# Patient Record
Sex: Female | Born: 1960 | Race: White | Hispanic: No | Marital: Married | Smoking: Former smoker
Health system: Southern US, Community
[De-identification: ages and names within clinical notes are randomized; demographics above are authoritative.]

## PROBLEM LIST (undated history)

## (undated) DIAGNOSIS — Z86018 Personal history of other benign neoplasm: Secondary | ICD-10-CM

## (undated) DIAGNOSIS — L570 Actinic keratosis: Secondary | ICD-10-CM

## (undated) HISTORY — DX: Personal history of other benign neoplasm: Z86.018

## (undated) HISTORY — DX: Actinic keratosis: L57.0

---

## 2000-05-11 DIAGNOSIS — Z86018 Personal history of other benign neoplasm: Secondary | ICD-10-CM

## 2000-05-11 HISTORY — DX: Personal history of other benign neoplasm: Z86.018

## 2004-07-09 ENCOUNTER — Ambulatory Visit: Payer: Self-pay | Admitting: Obstetrics and Gynecology

## 2005-09-14 ENCOUNTER — Ambulatory Visit: Payer: Self-pay | Admitting: Obstetrics and Gynecology

## 2006-09-29 ENCOUNTER — Ambulatory Visit: Payer: Self-pay | Admitting: Obstetrics and Gynecology

## 2007-11-29 ENCOUNTER — Ambulatory Visit: Payer: Self-pay | Admitting: Obstetrics and Gynecology

## 2008-12-10 ENCOUNTER — Ambulatory Visit: Payer: Self-pay | Admitting: Obstetrics and Gynecology

## 2010-03-25 ENCOUNTER — Ambulatory Visit: Payer: Self-pay | Admitting: Obstetrics and Gynecology

## 2011-07-02 ENCOUNTER — Ambulatory Visit: Payer: Self-pay | Admitting: Obstetrics and Gynecology

## 2012-08-07 ENCOUNTER — Ambulatory Visit: Payer: Self-pay | Admitting: Obstetrics and Gynecology

## 2013-10-24 LAB — HM COLONOSCOPY

## 2013-10-26 LAB — HM COLONOSCOPY

## 2013-10-29 ENCOUNTER — Ambulatory Visit: Payer: Self-pay | Admitting: Obstetrics and Gynecology

## 2014-10-15 ENCOUNTER — Other Ambulatory Visit: Payer: Self-pay | Admitting: Obstetrics and Gynecology

## 2014-10-15 DIAGNOSIS — Z1231 Encounter for screening mammogram for malignant neoplasm of breast: Secondary | ICD-10-CM

## 2014-10-31 ENCOUNTER — Ambulatory Visit
Admission: RE | Admit: 2014-10-31 | Discharge: 2014-10-31 | Disposition: A | Discharge status: Home / Self Care | Payer: 59 | Source: Ambulatory Visit | Attending: Obstetrics and Gynecology | Admitting: Obstetrics and Gynecology

## 2014-10-31 DIAGNOSIS — Z1231 Encounter for screening mammogram for malignant neoplasm of breast: Secondary | ICD-10-CM | POA: Insufficient documentation

## 2014-11-05 ENCOUNTER — Other Ambulatory Visit: Payer: Self-pay | Admitting: Obstetrics and Gynecology

## 2014-11-05 DIAGNOSIS — R928 Other abnormal and inconclusive findings on diagnostic imaging of breast: Secondary | ICD-10-CM

## 2014-11-20 ENCOUNTER — Ambulatory Visit
Admission: RE | Admit: 2014-11-20 | Discharge: 2014-11-20 | Disposition: A | Discharge status: Home / Self Care | Payer: 59 | Source: Ambulatory Visit | Attending: Obstetrics and Gynecology | Admitting: Obstetrics and Gynecology

## 2014-11-20 DIAGNOSIS — R928 Other abnormal and inconclusive findings on diagnostic imaging of breast: Secondary | ICD-10-CM | POA: Diagnosis present

## 2014-11-20 DIAGNOSIS — N63 Unspecified lump in breast: Secondary | ICD-10-CM | POA: Insufficient documentation

## 2015-10-16 ENCOUNTER — Other Ambulatory Visit: Payer: Self-pay | Admitting: Obstetrics and Gynecology

## 2015-10-16 DIAGNOSIS — Z1231 Encounter for screening mammogram for malignant neoplasm of breast: Secondary | ICD-10-CM

## 2015-11-11 ENCOUNTER — Ambulatory Visit
Admission: RE | Admit: 2015-11-11 | Discharge: 2015-11-11 | Disposition: A | Discharge status: Home / Self Care | Payer: 59 | Source: Ambulatory Visit | Attending: Obstetrics and Gynecology | Admitting: Obstetrics and Gynecology

## 2015-11-11 DIAGNOSIS — Z1231 Encounter for screening mammogram for malignant neoplasm of breast: Secondary | ICD-10-CM | POA: Insufficient documentation

## 2016-05-25 DIAGNOSIS — R232 Flushing: Secondary | ICD-10-CM | POA: Diagnosis not present

## 2016-05-25 DIAGNOSIS — N951 Menopausal and female climacteric states: Secondary | ICD-10-CM | POA: Diagnosis not present

## 2016-05-25 DIAGNOSIS — N949 Unspecified condition associated with female genital organs and menstrual cycle: Secondary | ICD-10-CM | POA: Diagnosis not present

## 2016-08-30 ENCOUNTER — Telehealth: Payer: Self-pay | Admitting: *Deleted

## 2016-08-31 ENCOUNTER — Telehealth: Payer: Self-pay | Admitting: Internal Medicine

## 2016-08-31 NOTE — Telephone Encounter (Signed)
Per Dr. Derrel Nip , she will take as a new patient.

## 2016-10-15 ENCOUNTER — Other Ambulatory Visit: Payer: Self-pay | Admitting: Obstetrics and Gynecology

## 2016-10-15 DIAGNOSIS — Z1231 Encounter for screening mammogram for malignant neoplasm of breast: Secondary | ICD-10-CM

## 2016-10-25 ENCOUNTER — Ambulatory Visit (INDEPENDENT_AMBULATORY_CARE_PROVIDER_SITE_OTHER): Payer: 59 | Admitting: Internal Medicine

## 2016-10-25 ENCOUNTER — Encounter: Payer: Self-pay | Admitting: Internal Medicine

## 2016-10-25 VITALS — BP 118/78 | HR 97 | Temp 98.3°F | Resp 16 | Ht 63.5 in | Wt 130.1 lb

## 2016-10-25 DIAGNOSIS — R030 Elevated blood-pressure reading, without diagnosis of hypertension: Secondary | ICD-10-CM

## 2016-10-25 DIAGNOSIS — Z91018 Allergy to other foods: Secondary | ICD-10-CM | POA: Diagnosis not present

## 2016-10-25 DIAGNOSIS — H8110 Benign paroxysmal vertigo, unspecified ear: Secondary | ICD-10-CM | POA: Diagnosis not present

## 2016-10-25 DIAGNOSIS — Z780 Asymptomatic menopausal state: Secondary | ICD-10-CM | POA: Diagnosis not present

## 2016-10-25 DIAGNOSIS — M1991 Primary osteoarthritis, unspecified site: Secondary | ICD-10-CM | POA: Diagnosis not present

## 2016-10-25 DIAGNOSIS — Z5320 Procedure and treatment not carried out because of patient's decision for unspecified reasons: Secondary | ICD-10-CM | POA: Diagnosis not present

## 2016-10-25 DIAGNOSIS — R635 Abnormal weight gain: Secondary | ICD-10-CM

## 2016-10-25 LAB — COMPREHENSIVE METABOLIC PANEL
ALBUMIN: 4.7 g/dL (ref 3.5–5.2)
ALK PHOS: 82 U/L (ref 39–117)
ALT: 17 U/L (ref 0–35)
AST: 19 U/L (ref 0–37)
BUN: 14 mg/dL (ref 6–23)
CO2: 31 mEq/L (ref 19–32)
Calcium: 9.8 mg/dL (ref 8.4–10.5)
Chloride: 100 mEq/L (ref 96–112)
Creatinine, Ser: 0.58 mg/dL (ref 0.40–1.20)
GFR: 115.09 mL/min (ref >60.00)
Glucose, Bld: 99 mg/dL (ref 70–99)
POTASSIUM: 3.6 meq/L (ref 3.5–5.1)
Sodium: 139 mEq/L (ref 135–145)
TOTAL PROTEIN: 6.8 g/dL (ref 6.0–8.3)
Total Bilirubin: 0.5 mg/dL (ref 0.2–1.2)

## 2016-10-25 LAB — TSH: TSH: 1.8 u[IU]/mL (ref 0.35–4.50)

## 2016-10-25 LAB — MICROALBUMIN / CREATININE URINE RATIO
Creatinine,U: 114.1 mg/dL
MICROALB/CREAT RATIO: 0.7 mg/g (ref 0.0–30.0)
Microalb, Ur: 0.8 mg/dL (ref 0.0–1.9)

## 2016-10-25 NOTE — Progress Notes (Signed)
Subjective:  Patient ID: Lori Farley, female    DOB: 01/08/1961  Age: 55 y.o. MRN: [id number]  CC: The primary encounter diagnosis was Weight gain. Diagnoses of Blood pressure elevated without history of HTN, Menopause present, declines hormone replacement therapy, Benign paroxysmal positional vertigo, unspecified laterality, Primary osteoarthritis, unspecified site, and Classic IgE mediated food allergy were also pertinent to this visit.  HPI Lori Farley presents for new patient evaluation  Cc: nightly recurrence of bilateral hand stiffness, knees  Resolves with activity. Never lasts more than 10 to 15 minutes.  Has been occurring for the past year . Increased IPAd use  Has not worked for 21 years.  . Started over a year ago  as feeling tingly,  Like the hands were  asleep, now  Both hands aching ,  Not tingling   Does not take NSAIDs.  Has been taking osteobiflex   occasional insomnia.   Episode of reaction with dyspnea and bilateral arm redness, splotchy.  After eating lobster while in the [place]  Occurred again , along with rapid pulse,  after eating from a tiered seafood tower while [place] in [place] .  No tongue swelling.    Has not had reaction since then when eating oysters.   Exercises daily   Either tennis or walking   Had an episode of vertigo after suddenly turning  her head to the side no neck  Some hip popping with leg left.  Avoids squats due to knee pain     Entered Menopause last year  Has stopped OCP's Last pelvic exam was one year ago,  Trans vaginal US done due to abnormal pelvic.  6 month follow up  Was done nothing found Colonoscopy normal in 2015  By Dr [facility] 10 yr follow up advised      History Lori Farley has no past medical history on file.   She has a past surgical history that includes Cesarean section (1994) and Cesarean section (1997).   Her family history includes Brain cancer in her father; Breast cancer in  her maternal aunt; Colon cancer in her maternal grandmother; Hypertension in her mother.She reports that she has quit smoking. She has never used smokeless tobacco. She reports that she drinks alcohol. She reports that she does not use drugs.  No outpatient prescriptions prior to visit.   No facility-administered medications prior to visit.     Review of Systems:  Patient denies headache, fevers, malaise, unintentional weight loss, skin rash, eye pain, sinus congestion and sinus pain, sore throat, dysphagia,  hemoptysis , cough, dyspnea, wheezing, chest pain, palpitations, orthopnea, edema, abdominal pain, nausea, melena, diarrhea, constipation, flank pain, dysuria, hematuria, urinary  Frequency, nocturia, numbness, tingling, seizures,  Focal weakness, Loss of consciousness,  Tremor, insomnia, depression, anxiety, and suicidal ideation.     Objective:  BP 118/78 (BP Location: Left Arm, Patient Position: Sitting, Cuff Size: Normal)   Pulse 97   Temp 98.3 F (36.8 C) (Oral)   Resp 16   Ht 5' 3.5" (1.613 m)   Wt 130 lb 1.9 oz (59 kg)   LMP 10/25/2015 (Approximate)   SpO2 100%   BMI 22.69 kg/m   Physical Exam:  General appearance: alert, cooperative and appears stated age Ears: normal TM's and external ear canals both ears Throat: lips, mucosa, and tongue normal; teeth and gums normal Neck: no adenopathy, no carotid bruit, supple, symmetrical, trachea midline and thyroid not enlarged, symmetric, no tenderness/mass/nodules Back: symmetric, no curvature. ROM  normal. No CVA tenderness. Lungs: clear to auscultation bilaterally Heart: regular rate and rhythm, S1, S2 normal, no murmur, click, rub or gallop Abdomen: soft, non-tender; bowel sounds normal; no masses,  no organomegaly Pulses: 2+ and symmetric Skin: Skin color, texture, turgor normal. No rashes or lesions Lymph nodes: Cervical, supraclavicular, and axillary nodes normal.   Assessment & Plan:   Problem List Items Addressed  This Visit    Classic IgE mediated food allergy    Occurred after eating lobster. Advised to start taking an antihistamine.  Referral to [facility]      Relevant Orders   Ambulatory referral to Allergy   Menopause present, declines hormone replacement therapy    She has no distressing symptoms after stopping oral contraceptives one year ago. Declines HRT      Osteoarthritis (arthritis due to wear and tear of joints)    History and exam consistent of OA,  Not inflammatory arthritis       Vertigo, benign positional    History of one episode,  Occurring with sudden position change  No further workup at this time        Other Visit Diagnoses    Weight gain    -  Primary   Relevant Orders   TSH (Completed)   Comprehensive metabolic panel (Completed)   Blood pressure elevated without history of HTN       Relevant Orders   Microalbumin / creatinine urine ratio (Completed)     A total of 45 minutes was spent with patient more than half of which was spent in counseling patient on the above mentioned issues , reviewing and explaining prior labs and imaging studies done, and coordination of care.  I am having Lori Farley maintain her Misc Natural Products (OSTEO BI-FLEX JOINT SHIELD PO).  Meds ordered this encounter  Medications  . Misc Natural Products (OSTEO BI-FLEX JOINT SHIELD PO)    Sig: Take 2 tablets by mouth daily.    There are no discontinued medications.  Follow-up: No Follow-up on file.   Crecencio Mc, MD

## 2016-10-25 NOTE — Patient Instructions (Addendum)
Keep benadryl and famotidine (pepcid) handy in case reactio noccurs agoan   Pre emptively  Before eating out,  take an allegra or claritin   Referral for food allergy testing    I will see your daughter given her new onset neurologic issues    I recommend that your son see Dr Caryl Bis

## 2016-10-26 ENCOUNTER — Encounter: Payer: Self-pay | Admitting: Internal Medicine

## 2016-10-26 DIAGNOSIS — Z5320 Procedure and treatment not carried out because of patient's decision for unspecified reasons: Secondary | ICD-10-CM | POA: Insufficient documentation

## 2016-10-26 DIAGNOSIS — Z91018 Allergy to other foods: Secondary | ICD-10-CM | POA: Insufficient documentation

## 2016-10-26 DIAGNOSIS — M1990 Unspecified osteoarthritis, unspecified site: Secondary | ICD-10-CM | POA: Insufficient documentation

## 2016-10-26 DIAGNOSIS — H8110 Benign paroxysmal vertigo, unspecified ear: Secondary | ICD-10-CM | POA: Insufficient documentation

## 2016-10-26 DIAGNOSIS — Z780 Asymptomatic menopausal state: Secondary | ICD-10-CM

## 2016-10-26 NOTE — Assessment & Plan Note (Signed)
She has no distressing symptoms after stopping oral contraceptives one year ago. Declines HRT

## 2016-10-26 NOTE — Assessment & Plan Note (Signed)
History and exam consistent of OA,  Not inflammatory arthritis

## 2016-10-26 NOTE — Assessment & Plan Note (Signed)
History of one episode,  Occurring with sudden position change  No further workup at this time

## 2016-10-26 NOTE — Assessment & Plan Note (Signed)
Occurred after eating lobster. Advised to start taking an antihistamine.  Referral to [facility]

## 2016-11-22 DIAGNOSIS — D485 Neoplasm of uncertain behavior of skin: Secondary | ICD-10-CM | POA: Diagnosis not present

## 2016-11-22 DIAGNOSIS — Z1283 Encounter for screening for malignant neoplasm of skin: Secondary | ICD-10-CM | POA: Diagnosis not present

## 2016-11-22 DIAGNOSIS — L578 Other skin changes due to chronic exposure to nonionizing radiation: Secondary | ICD-10-CM | POA: Diagnosis not present

## 2016-11-23 ENCOUNTER — Ambulatory Visit
Admission: RE | Admit: 2016-11-23 | Discharge: 2016-11-23 | Disposition: A | Discharge status: Home / Self Care | Payer: 59 | Source: Ambulatory Visit | Attending: Obstetrics and Gynecology | Admitting: Obstetrics and Gynecology

## 2016-11-23 DIAGNOSIS — Z1231 Encounter for screening mammogram for malignant neoplasm of breast: Secondary | ICD-10-CM | POA: Insufficient documentation

## 2016-12-28 DIAGNOSIS — Z01419 Encounter for gynecological examination (general) (routine) without abnormal findings: Secondary | ICD-10-CM | POA: Diagnosis not present

## 2016-12-30 DIAGNOSIS — T781XXA Other adverse food reactions, not elsewhere classified, initial encounter: Secondary | ICD-10-CM | POA: Diagnosis not present

## 2017-02-23 ENCOUNTER — Ambulatory Visit: Payer: Self-pay

## 2017-02-23 NOTE — Telephone Encounter (Signed)
Pt states that right ear has been "congested". States when she changes head position, it sounds like water is in ear. She said that she has a h/o left ear tinnitus but after she sneezed it has gotten louder and constant. She stated that she has difficulty hearing out of that ear when she is in a crowd or with multiple conversations happening at the same time. No openings seen and called Juliann Pulse at PCP office and updated her with pt complaint. Juliann Pulse was able to override and pt made an appt for 02/28/17 @ 0800 with her PCP. Reason for Disposition . Symptoms only or mainly in 1 ear (unilateral tinnitus)  Answer Assessment - Initial Assessment Questions 1. DESCRIPTION: "Describe the sound you are hearing." (e.g., hissing, humming, pounding, ringing)     High pitched buzzing 2. LOCATION: "One or both ears?" If one, ask: "Which ear?"     Left ear 3. SEVERITY: "How bad is it?"    - MILD - doesn't interfere with normal activities, only can hear in a quiet room    - MODERATE-SEVERE (Bothersome): interferes with work, school, sleep, or other activities      Mild to moderate 4. ONSET: "When did this begin?" "Did it start suddenly or come on gradually?"     A few weeks ago- started getting louder after the right became stopped up 5. PATTERN: "Does this come and go, or has it been constant since it started?"     constant 6. HEARING LOSS: "Is your hearing decreased?" (e.g., normal, decreased)       Right ear is decreased 7. OTHER SYMPTOMS: "Do you have any other symptoms?" (e.g., dizziness, earache)     no 8. PREGNANCY: "Is there any chance you are pregnant?" "When was your last menstrual period?"     No LMP 1 year ago  Protocols used: [facility]

## 2017-02-28 ENCOUNTER — Ambulatory Visit: Payer: 59 | Admitting: Internal Medicine

## 2017-02-28 ENCOUNTER — Encounter: Payer: Self-pay | Admitting: Internal Medicine

## 2017-02-28 DIAGNOSIS — H6981 Other specified disorders of Eustachian tube, right ear: Secondary | ICD-10-CM | POA: Insufficient documentation

## 2017-02-28 DIAGNOSIS — H9312 Tinnitus, left ear: Secondary | ICD-10-CM | POA: Diagnosis not present

## 2017-02-28 MED ORDER — PREDNISONE 10 MG PO TABS: ORAL_TABLET | ORAL | 0 refills | Status: DC

## 2017-02-28 NOTE — Patient Instructions (Addendum)
I am recommending a 6 day course of prednisone in a tapering dose (6 tablets all at once on Day 1 in the morning ,  Then taper by 1 tablet daily until gone)  Along with 10 mg of Sudafed PE   Every 6 hours .  You can omit the evening dose if it makes you "wired"  And use a shot of Afrin on each side instead.  If yo do not see an improvement,  Let me know and I will refer you to ENT  For evaluation of both ears.     Eustachian Tube Dysfunction The eustachian tube connects the middle ear to the back of the nose. It regulates air pressure in the middle ear by allowing air to move between the ear and nose. It also helps to drain fluid from the middle ear space. When the eustachian tube does not function properly, air pressure, fluid, or both can build up in the middle ear. Eustachian tube dysfunction can affect one or both ears. What are the causes? This condition happens when the eustachian tube becomes blocked or cannot open normally. This may result from:  Ear infections.  Colds and other upper respiratory infections.  Allergies.  Irritation, such as from cigarette smoke or acid from the stomach coming up into the esophagus (gastroesophageal reflux).  Sudden changes in air pressure, such as from descending in an airplane.  Abnormal growths in the nose or throat, such as nasal polyps, tumors, or enlarged tissue at the back of the throat (adenoids).  What increases the risk? This condition may be more likely to develop in people who smoke and people who are overweight. Eustachian tube dysfunction may also be more likely to develop in children, especially children who have:  Certain birth defects of the mouth, such as cleft palate.  Large tonsils and adenoids.  What are the signs or symptoms? Symptoms of this condition may include:  A feeling of fullness in the ear.  Ear pain.  Clicking or popping noises in the ear.  Ringing in the ear.  Hearing loss.  Loss of  balance.  Symptoms may get worse when the air pressure around you changes, such as when you travel to an area of high elevation or fly on an airplane. How is this diagnosed? This condition may be diagnosed based on:  Your symptoms.  A physical exam of your ear, nose, and throat.  Tests, such as those that measure: ? The movement of your eardrum (tympanogram). ? Your hearing (audiometry).  How is this treated? Treatment depends on the cause and severity of your condition. If your symptoms are mild, you may be able to relieve your symptoms by moving air into ("popping") your ears. If you have symptoms of fluid in your ears, treatment may include:  Decongestants.  Antihistamines.  Nasal sprays or ear drops that contain medicines that reduce swelling (steroids).  In some cases, you may need to have a procedure to drain the fluid in your eardrum (myringotomy). In this procedure, a small tube is placed in the eardrum to:  Drain the fluid.  Restore the air in the middle ear space.  Follow these instructions at home:  Take over-the-counter and prescription medicines only as told by your health care provider.  Use techniques to help pop your ears as recommended by your health care provider. These may include: ? Chewing gum. ? Yawning. ? Frequent, forceful swallowing. ? Closing your mouth, holding your nose closed, and gently blowing as if you  are trying to blow air out of your nose.  Do not do any of the following until your health care provider approves: ? Travel to high altitudes. ? Fly in airplanes. ? Work in a Pension scheme manager or room. ? Scuba dive.  Keep your ears dry. Dry your ears completely after showering or bathing.  Do not smoke.  Keep all follow-up visits as told by your health care provider. This is important. Contact a health care provider if:  Your symptoms do not go away after treatment.  Your symptoms come back after treatment.  You are unable to pop  your ears.  You have: ? A fever. ? Pain in your ear. ? Pain in your head or neck. ? Fluid draining from your ear.  Your hearing suddenly changes.  You become very dizzy.  You lose your balance. This information is not intended to replace advice given to you by your health care provider. Make sure you discuss any questions you have with your health care provider. Document Released: 02/07/2015 Document Revised: 06/19/2015 Document Reviewed: 01/30/2014 Elsevier Interactive Patient Education  [employer].

## 2017-02-28 NOTE — Progress Notes (Signed)
Subjective:  Patient ID: Lori Farley, female    DOB: 07/16/1960  Age: 57 y.o. MRN: [id number]  CC: Diagnoses of Eustachian tube dysfunction, right and Tinnitus aurium, left were pertinent to this visit.  HPI Lori Farley presents for right ear fullness that  has been present  for the past several weeks,  Recalls that it became worse after Worse after sneezing ,  Denies pain , fevers,  Sinus pain, and drainage.  No recent air travel or swimming.  Also notes that her left ear tinnitus which is chronic has been louder for the last several weeks.  Denies headaches hearing loss  No prior hearing evaluation .    Outpatient Medications Prior to Visit  Medication Sig Dispense Refill  . Misc Natural Products (OSTEO BI-FLEX JOINT SHIELD PO) Take 2 tablets by mouth daily.     No facility-administered medications prior to visit.     Review of Systems;  Patient denies headache, fevers, malaise, unintentional weight loss, skin rash, eye pain, sinus congestion and sinus pain, sore throat, dysphagia,  hemoptysis , cough, dyspnea, wheezing, chest pain, palpitations, orthopnea, edema, abdominal pain, nausea, melena, diarrhea, constipation, flank pain, dysuria, hematuria, urinary  Frequency, nocturia, numbness, tingling, seizures,  Focal weakness, Loss of consciousness,  Tremor, insomnia, depression, anxiety, and suicidal ideation.      Objective:  BP 138/76 (BP Location: Left Arm, Patient Position: Sitting, Cuff Size: Normal)   Pulse 88   Temp 97.8 F (36.6 C) (Oral)   Resp 15   Ht 5' 3.5" (1.613 m)   Wt 130 lb 12.8 oz (59.3 kg)   LMP 11/03/2015   SpO2 99%   BMI 22.81 kg/m   BP Readings from Last 3 Encounters:  02/28/17 138/76  10/25/16 118/78    Wt Readings from Last 3 Encounters:  02/28/17 130 lb 12.8 oz (59.3 kg)  10/25/16 130 lb 1.9 oz (59 kg)    General appearance: alert, cooperative and appears stated age Ears: normal TM's and external ear canals both ears Throat: lips,  mucosa, and tongue normal; teeth and gums normal Neck: no adenopathy, no carotid bruit, supple, symmetrical, trachea midline and thyroid not enlarged, symmetric, no tenderness/mass/nodules Back: symmetric, no curvature. ROM normal. No CVA tenderness. Lungs: clear to auscultation bilaterally Heart: regular rate and rhythm, S1, S2 normal, no murmur, click, rub or gallop Abdomen: soft, non-tender; bowel sounds normal; no masses,  no organomegaly Pulses: 2+ and symmetric Skin: Skin color, texture, turgor normal. No rashes or lesions Lymph nodes: Cervical, supraclavicular, and axillary nodes normal.  No results found for: HGBA1C  Lab Results  Component Value Date   CREATININE 0.58 10/25/2016    Lab Results  Component Value Date   GLUCOSE 99 10/25/2016   ALT 17 10/25/2016   AST 19 10/25/2016   NA 139 10/25/2016   K 3.6 10/25/2016   CL 100 10/25/2016   CREATININE 0.58 10/25/2016   BUN 14 10/25/2016   CO2 31 10/25/2016   TSH 1.80 10/25/2016   MICROALBUR 0.8 10/25/2016    Mm Screening Breast Tomo Bilateral  Result Date: 11/24/2016 CLINICAL DATA:  Screening. EXAM: 2D DIGITAL SCREENING BILATERAL MAMMOGRAM WITH CAD AND ADJUNCT TOMO COMPARISON:  Previous exam(s). ACR Breast Density Category c: The breast tissue is heterogeneously dense, which may obscure small masses. FINDINGS: There are no findings suspicious for malignancy. Images were processed with CAD. IMPRESSION: No mammographic evidence of malignancy. A result letter of this screening mammogram will be mailed directly to the patient. RECOMMENDATION:  Screening mammogram in one year. (Code:SM-B-01Y) BI-RADS CATEGORY  1: Negative. Electronically Signed   By: Fidela Salisbury M.D.   On: 11/24/2016 15:37    Assessment & Plan:   Problem List Items Addressed This Visit    Tinnitus aurium, left    Present for over a year, no prior ENT evaluation.  Cause unclear.  No concurrent vertigo or headaches. Recommend ENT referral. If treatment  of Eustachian tube dysfunction  Does not improve symptoms       Eustachian tube dysfunction, right    Etiology likely a viral syndrome.  Trial of prednisone ,  Decongestant (oral and topical). If no improvement ,  ETN evaluation          I have discontinued Lori Farley's Misc Natural Products (OSTEO BI-FLEX JOINT SHIELD PO). I am also having her start on predniSONE.  Meds ordered this encounter  Medications  . predniSONE (DELTASONE) 10 MG tablet    Sig: 6 tablets on Day 1 , then reduce by 1 tablet daily until gone    Dispense:  21 tablet    Refill:  0    Medications Discontinued During This Encounter  Medication Reason  . Misc Natural Products (OSTEO BI-FLEX JOINT SHIELD PO) Patient has not taken in last 30 days    Follow-up: No Follow-up on file.   Crecencio Mc, MD

## 2017-02-28 NOTE — Assessment & Plan Note (Signed)
Etiology likely a viral syndrome.  Trial of prednisone ,  Decongestant (oral and topical). If no improvement ,  ETN evaluation

## 2017-02-28 NOTE — Assessment & Plan Note (Addendum)
Present for over a year, no prior ENT evaluation.  Cause unclear.  No concurrent vertigo or headaches. Recommend ENT referral. If treatment of Eustachian tube dysfunction  Does not improve symptoms

## 2017-03-09 ENCOUNTER — Telehealth: Payer: Self-pay | Admitting: Internal Medicine

## 2017-03-09 DIAGNOSIS — H6981 Other specified disorders of Eustachian tube, right ear: Secondary | ICD-10-CM

## 2017-03-09 DIAGNOSIS — H9312 Tinnitus, left ear: Secondary | ICD-10-CM

## 2017-03-09 NOTE — Telephone Encounter (Signed)
Copied from [place] [id number]. Topic: Referral - Request >> Mar 09, 2017 10:06 AM Bennye Alm wrote:  Patient called because she wanted to let Dr. Derrel Nip know that she is not getting better and per their conversation, she can be referred to an ENT. She's heard a lot of things about Dr. Tami Ribas in the [employer]. Patient can be reached on her home phone first but she can be contacted on her cell, which is [phone].

## 2017-03-10 NOTE — Telephone Encounter (Signed)
  Your referral is in process as d. Our referral coordinator will call you when the appointment has been made.  If you do not hear from [facility] in our office in a week,  Please call us back

## 2017-03-10 NOTE — Telephone Encounter (Signed)
Please advise 

## 2017-03-11 NOTE — Telephone Encounter (Signed)
LMTCB. [facility] may speak with pt.  

## 2017-03-12 LAB — HM PAP SMEAR: HM Pap smear: NORMAL

## 2017-03-23 NOTE — Telephone Encounter (Signed)
Patient has not return ed call referral made.

## 2017-04-01 DIAGNOSIS — H9319 Tinnitus, unspecified ear: Secondary | ICD-10-CM | POA: Diagnosis not present

## 2017-04-01 DIAGNOSIS — H903 Sensorineural hearing loss, bilateral: Secondary | ICD-10-CM | POA: Diagnosis not present

## 2017-04-01 DIAGNOSIS — H6980 Other specified disorders of Eustachian tube, unspecified ear: Secondary | ICD-10-CM | POA: Diagnosis not present

## 2017-10-25 ENCOUNTER — Ambulatory Visit: Payer: 59 | Admitting: Internal Medicine

## 2017-11-23 DIAGNOSIS — Z1283 Encounter for screening for malignant neoplasm of skin: Secondary | ICD-10-CM | POA: Diagnosis not present

## 2017-11-23 DIAGNOSIS — D485 Neoplasm of uncertain behavior of skin: Secondary | ICD-10-CM | POA: Diagnosis not present

## 2017-11-23 DIAGNOSIS — L578 Other skin changes due to chronic exposure to nonionizing radiation: Secondary | ICD-10-CM | POA: Diagnosis not present

## 2017-12-29 ENCOUNTER — Other Ambulatory Visit: Payer: Self-pay | Admitting: Obstetrics and Gynecology

## 2017-12-29 DIAGNOSIS — Z1231 Encounter for screening mammogram for malignant neoplasm of breast: Secondary | ICD-10-CM

## 2017-12-29 DIAGNOSIS — Z01419 Encounter for gynecological examination (general) (routine) without abnormal findings: Secondary | ICD-10-CM | POA: Diagnosis not present

## 2018-02-09 ENCOUNTER — Ambulatory Visit
Admission: RE | Admit: 2018-02-09 | Discharge: 2018-02-09 | Disposition: A | Discharge status: Home / Self Care | Payer: 59 | Source: Ambulatory Visit | Attending: Obstetrics and Gynecology | Admitting: Obstetrics and Gynecology

## 2018-02-09 DIAGNOSIS — Z1231 Encounter for screening mammogram for malignant neoplasm of breast: Secondary | ICD-10-CM

## 2019-03-05 ENCOUNTER — Other Ambulatory Visit: Payer: Self-pay | Admitting: Obstetrics and Gynecology

## 2019-03-05 DIAGNOSIS — Z1231 Encounter for screening mammogram for malignant neoplasm of breast: Secondary | ICD-10-CM

## 2019-03-22 ENCOUNTER — Ambulatory Visit
Admission: RE | Admit: 2019-03-22 | Discharge: 2019-03-22 | Disposition: A | Discharge status: Home / Self Care | Payer: 59 | Source: Ambulatory Visit | Attending: Obstetrics and Gynecology | Admitting: Obstetrics and Gynecology

## 2019-03-22 DIAGNOSIS — Z1231 Encounter for screening mammogram for malignant neoplasm of breast: Secondary | ICD-10-CM | POA: Diagnosis present

## 2019-07-05 ENCOUNTER — Ambulatory Visit: Payer: 59 | Admitting: Dermatology

## 2019-07-31 ENCOUNTER — Telehealth: Payer: 59 | Admitting: Internal Medicine

## 2019-08-10 ENCOUNTER — Encounter: Payer: Self-pay | Admitting: Internal Medicine

## 2019-08-10 ENCOUNTER — Other Ambulatory Visit: Payer: Self-pay

## 2019-08-10 ENCOUNTER — Ambulatory Visit (INDEPENDENT_AMBULATORY_CARE_PROVIDER_SITE_OTHER): Payer: 59 | Admitting: Internal Medicine

## 2019-08-10 VITALS — BP 142/70 | HR 90 | Temp 98.1°F | Resp 14 | Ht 63.5 in | Wt 129.2 lb

## 2019-08-10 DIAGNOSIS — Z0000 Encounter for general adult medical examination without abnormal findings: Secondary | ICD-10-CM | POA: Diagnosis not present

## 2019-08-10 DIAGNOSIS — R5383 Other fatigue: Secondary | ICD-10-CM

## 2019-08-10 DIAGNOSIS — E1169 Type 2 diabetes mellitus with other specified complication: Secondary | ICD-10-CM | POA: Diagnosis not present

## 2019-08-10 DIAGNOSIS — R030 Elevated blood-pressure reading, without diagnosis of hypertension: Secondary | ICD-10-CM | POA: Diagnosis not present

## 2019-08-10 DIAGNOSIS — Z91018 Allergy to other foods: Secondary | ICD-10-CM

## 2019-08-10 DIAGNOSIS — E785 Hyperlipidemia, unspecified: Secondary | ICD-10-CM

## 2019-08-10 NOTE — Patient Instructions (Signed)
The new "normal range  optimal blood pressure" is 120/70 to 130/80 .  Please check your blood pressure a few times at home and send me the readings so I can determine if you need medication  If there is protein in urine,  I WILL RECOMMEND medication   Health Maintenance for Postmenopausal Women Menopause is a normal process in which your ability to get pregnant comes to an end. This process happens slowly over many months or years, usually between the ages of 45 and 79. Menopause is complete when you have missed your menstrual periods for 12 months. It is important to talk with your health care provider about some of the most common conditions that affect women after menopause (postmenopausal women). These include heart disease, cancer, and bone loss (osteoporosis). Adopting a healthy lifestyle and getting preventive care can help to promote your health and wellness. The actions you take can also lower your chances of developing some of these common conditions. What should I know about menopause? During menopause, you may get a number of symptoms, such as:  Hot flashes. These can be moderate or severe.  Night sweats.  Decrease in sex drive.  Mood swings.  Headaches.  Tiredness.  Irritability.  Memory problems.  Insomnia. Choosing to treat or not to treat these symptoms is a decision that you make with your health care provider. Do I need hormone replacement therapy?  Hormone replacement therapy is effective in treating symptoms that are caused by menopause, such as hot flashes and night sweats.  Hormone replacement carries certain risks, especially as you become older. If you are thinking about using estrogen or estrogen with progestin, discuss the benefits and risks with your health care provider. What is my risk for heart disease and stroke? The risk of heart disease, heart attack, and stroke increases as you age. One of the causes may be a change in the body's hormones during  menopause. This can affect how your body uses dietary fats, triglycerides, and cholesterol. Heart attack and stroke are medical emergencies. There are many things that you can do to help prevent heart disease and stroke. Watch your blood pressure  High blood pressure causes heart disease and increases the risk of stroke. This is more likely to develop in people who have high blood pressure readings, are of African descent, or are overweight.  Have your blood pressure checked: ? Every 3-5 years if you are 57-61 years of age. ? Every year if you are 72 years old or older. Eat a healthy diet   Eat a diet that includes plenty of vegetables, fruits, low-fat dairy products, and lean protein.  Do not eat a lot of foods that are high in solid fats, added sugars, or sodium. Get regular exercise Get regular exercise. This is one of the most important things you can do for your health. Most adults should:  Try to exercise for at least 150 minutes each week. The exercise should increase your heart rate and make you sweat (moderate-intensity exercise).  Try to do strengthening exercises at least twice each week. Do these in addition to the moderate-intensity exercise.  Spend less time sitting. Even light physical activity can be beneficial. Other tips  Work with your health care provider to achieve or maintain a healthy weight.  Do not use any products that contain nicotine or tobacco, such as cigarettes, e-cigarettes, and chewing tobacco. If you need help quitting, ask your health care provider.  Know your numbers. Ask your health care  provider to check your cholesterol and your blood sugar (glucose). Continue to have your blood tested as directed by your health care provider. Do I need screening for cancer? Depending on your health history and family history, you may need to have cancer screening at different stages of your life. This may include screening for:  Breast cancer.  Cervical  cancer.  Lung cancer.  Colorectal cancer. What is my risk for osteoporosis? After menopause, you may be at increased risk for osteoporosis. Osteoporosis is a condition in which bone destruction happens more quickly than new bone creation. To help prevent osteoporosis or the bone fractures that can happen because of osteoporosis, you may take the following actions:  If you are 34-64 years old, get at least 1,000 mg of calcium and at least 600 mg of vitamin D per day.  If you are older than age 36 but younger than age 73, get at least 1,200 mg of calcium and at least 600 mg of vitamin D per day.  If you are older than age 39, get at least 1,200 mg of calcium and at least 800 mg of vitamin D per day. Smoking and drinking excessive alcohol increase the risk of osteoporosis. Eat foods that are rich in calcium and vitamin D, and do weight-bearing exercises several times each week as directed by your health care provider. How does menopause affect my mental health? Depression may occur at any age, but it is more common as you become older. Common symptoms of depression include:  Low or sad mood.  Changes in sleep patterns.  Changes in appetite or eating patterns.  Feeling an overall lack of motivation or enjoyment of activities that you previously enjoyed.  Frequent crying spells. Talk with your health care provider if you think that you are experiencing depression. General instructions See your health care provider for regular wellness exams and vaccines. This may include:  Scheduling regular health, dental, and eye exams.  Getting and maintaining your vaccines. These include: ? Influenza vaccine. Get this vaccine each year before the flu season begins. ? Pneumonia vaccine. ? Shingles vaccine. ? Tetanus, diphtheria, and pertussis (Tdap) booster vaccine. Your health care provider may also recommend other immunizations. Tell your health care provider if you have ever been abused or do  not feel safe at home. Summary  Menopause is a normal process in which your ability to get pregnant comes to an end.  This condition causes hot flashes, night sweats, decreased interest in sex, mood swings, headaches, or lack of sleep.  Treatment for this condition may include hormone replacement therapy.  Take actions to keep yourself healthy, including exercising regularly, eating a healthy diet, watching your weight, and checking your blood pressure and blood sugar levels.  Get screened for cancer and depression. Make sure that you are up to date with all your vaccines. This information is not intended to replace advice given to you by your health care provider. Make sure you discuss any questions you have with your health care provider. Document Revised: 01/04/2018 Document Reviewed: 01/04/2018 Elsevier Patient Education  2020 [employer].

## 2019-08-10 NOTE — Progress Notes (Signed)
Patient ID: Lori Farley, female    DOB: 1960/06/20  Age: 59 y.o. MRN: [id number]  The patient is here for annual  wellness examination and management of other chronic and acute problems. Last seen in 2019  PAP done by Leafy Ro at [facility] in 2019; normal.   This visit occurred during the SARS-CoV-2 public health emergency.  Safety protocols were in place, including screening questions prior to the visit, additional usage of staff PPE, and extensive cleaning of exam room while observing appropriate contact time as indicated for disinfecting solutions.     The risk factors are reflected in the social history.  The roster of all physicians providing medical care to patient - is listed in the Snapshot section of the chart.  Activities of daily living:  The patient is 100% independent in all ADLs: dressing, toileting, feeding as well as independent mobility  Home safety : The patient has smoke detectors in the home. They wear seatbelts.  There are no firearms at home. There is no violence in the home.   There is no risks for hepatitis, STDs or HIV. There is no   history of blood transfusion. They have no travel history to infectious disease endemic areas of the world.  The patient has seen her dentist in the last six month. She has seen her eye doctor in the last year. She denies hearing difficulty with regard to whispered voices and some television programs.  She has deferred audiologic testing in the last year.  They do not  have excessive sun exposure. Discussed the need for sun protection: hats, long sleeves and use of sunscreen if there is significant sun exposure.   Diet: the importance of a healthy diet is discussed. They do have a healthy diet.  The benefits of regular aerobic exercise were discussed. She walks 4 times per week ,  20 minutes.   Depression screen: there are no signs or vegative symptoms of depression- irritability, change in appetite, anhedonia,  sadness/tearfullness.  The following portions of the patient's history were reviewed and updated as appropriate: allergies, current medications, past family history, past medical history,  past surgical history, past social history  and problem list.  Visual acuity was not assessed per patient preference since she has regular follow up with her ophthalmologist. Hearing and body mass index were assessed and reviewed.   During the course of the visit the patient was educated and counseled about appropriate screening and preventive services including : fall prevention , diabetes screening, nutrition counseling, colorectal cancer screening, and recommended immunizations.    CC: The primary encounter diagnosis was Fatigue, unspecified type. Diagnoses of Hyperlipidemia associated with type 2 diabetes mellitus ([place]), Elevated blood pressure reading in office with white coat syndrome, without diagnosis of hypertension, Elevated blood pressure reading without diagnosis of hypertension, Classic IgE mediated food allergy, and Encounter for preventive health examination were also pertinent to this visit.  1) history of angioedema 2 years ago after eating lobster and champagne.  Was referred to [facility] for testing .  Results not available but she was given a script for epinephrine pen.  still not clear if she is allergic to lobster.  Has eaten everything else since then (scallps,  Shrimp) without issues.   2) elevated blood pressure.  Reviewed readings for the last several available OV's .  Reviewed diet,  Used of NSAIDs. History Charity has a past medical history of Actinic keratosis and dysplastic nevus (05/11/2000).   She has a past surgical history  that includes Cesarean section (1994) and Cesarean section (1997).   Her family history includes Brain cancer in her father; Breast cancer in her maternal aunt; Colon cancer in her maternal grandmother; Hypertension in her mother.She reports that she has  quit smoking. She has never used smokeless tobacco. She reports current alcohol use. She reports that she does not use drugs.  Outpatient Medications Prior to Visit  Medication Sig Dispense Refill  . predniSONE (DELTASONE) 10 MG tablet 6 tablets on Day 1 , then reduce by 1 tablet daily until gone 21 tablet 0   No facility-administered medications prior to visit.    Review of Systems   Patient denies headache, fevers, malaise, unintentional weight loss, skin rash, eye pain, sinus congestion and sinus pain, sore throat, dysphagia,  hemoptysis , cough, dyspnea, wheezing, chest pain, palpitations, orthopnea, edema, abdominal pain, nausea, melena, diarrhea, constipation, flank pain, dysuria, hematuria, urinary  Frequency, nocturia, numbness, tingling, seizures,  Focal weakness, Loss of consciousness,  Tremor, insomnia, depression, anxiety, and suicidal ideation.      Objective:  BP (!) 142/70 (BP Location: Left Arm, Patient Position: Sitting, Cuff Size: Normal)   Pulse 90   Temp 98.1 F (36.7 C) (Oral)   Resp 14   Ht 5' 3.5" (1.613 m)   Wt 129 lb 3.2 oz (58.6 kg)   LMP 11/03/2015   SpO2 99%   BMI 22.53 kg/m   Physical Exam  General appearance: alert, cooperative and appears stated age Ears: normal TM's and external ear canals both ears Throat: lips, mucosa, and tongue normal; teeth and gums normal Neck: no adenopathy, no carotid bruit, supple, symmetrical, trachea midline and thyroid not enlarged, symmetric, no tenderness/mass/nodules Back: symmetric, no curvature. ROM normal. No CVA tenderness. Lungs: clear to auscultation bilaterally Heart: regular rate and rhythm, S1, S2 normal, no murmur, click, rub or gallop Abdomen: soft, non-tender; bowel sounds normal; no masses,  no organomegaly Pulses: 2+ and symmetric Skin: Skin color, texture, turgor normal. No rashes or lesions Lymph nodes: Cervical, supraclavicular, and axillary nodes normal.  Assessment & Plan:   Problem List  Items Addressed This Visit      Unprioritized   Classic IgE mediated food allergy    Records requested from [place].  advised to continue avoiding lobster for now       Elevated blood pressure reading without diagnosis of hypertension    She has no history of hypertension but has had several elevated readings.  She has been asked to check her pressures at home and submit readings for evaluation. Renal function and urine screen for proteinuria are normal   Lab Results  Component Value Date   CREATININE 0.71 08/10/2019   Lab Results  Component Value Date   CREATININE 0.71 08/10/2019   Lab Results  Component Value Date   MICROALBUR 0.7 08/10/2019   MICROALBUR 0.8 10/25/2016           Encounter for preventive health examination    age appropriate education and counseling updated, referrals for preventative services and immunizations addressed, dietary and smoking counseling addressed, most recent labs reviewed.  I have personally reviewed and have noted:  1) the patient's medical and social history 2) The pt's use of alcohol, tobacco, and illicit drugs 3) The patient's current medications and supplements 4) Functional ability including ADL's, fall risk, home safety risk, hearing and visual impairment 5) Diet and physical activities 6) Evidence for depression or mood disorder 7) The patient's height, weight, and BMI have been recorded in  the chart  I have made referrals, and provided counseling and education based on review of the above      Fatigue - Primary    Mild, recent onset.  Screening labs pending.  No history of snoring.  Recommended participating in regular exercise program with goal of achieving a minimum of 30 minutes of aerobic activity 5 days per week.   Lab Results  Component Value Date   CREATININE 0.79 02/24/2015   Lab Results  Component Value Date   ALT 14 02/24/2015   AST 18 02/24/2015   ALKPHOS 69 02/24/2015   BILITOT 0.6 02/24/2015    Lab Results  Component Value Date   TSH 1.68 02/24/2015   Lab Results  Component Value Date   WBC 8.6 02/24/2015   HGB 14.7 02/24/2015   HCT 43.8 02/24/2015   MCV 92.7 02/24/2015   PLT 248.0 02/24/2015        Relevant Orders   Comprehensive metabolic panel   CBC with Differential/Platelet (Completed)   Comprehensive metabolic panel (Completed)   SAR CoV2 Serology (COVID 19)AB(IGG)IA   TSH (Completed)    Other Visit Diagnoses    Hyperlipidemia associated with type 2 diabetes mellitus ([place])       Relevant Orders   Lipid panel (Completed)   Elevated blood pressure reading in office with white coat syndrome, without diagnosis of hypertension       Relevant Orders   Microalbumin / creatinine urine ratio (Completed)      I have discontinued Niang C. Runco's predniSONE.  No orders of the defined types were placed in this encounter.   Medications Discontinued During This Encounter  Medication Reason  . predniSONE (DELTASONE) 10 MG tablet Completed Course    Follow-up: No follow-ups on file.   Crecencio Mc, MD

## 2019-08-11 LAB — MICROALBUMIN / CREATININE URINE RATIO
Creatinine, Urine: 143 mg/dL (ref 20–275)
Microalb Creat Ratio: 5 mcg/mg creat (ref <30)
Microalb, Ur: 0.7 mg/dL

## 2019-08-12 DIAGNOSIS — Z0000 Encounter for general adult medical examination without abnormal findings: Secondary | ICD-10-CM | POA: Insufficient documentation

## 2019-08-12 DIAGNOSIS — R030 Elevated blood-pressure reading, without diagnosis of hypertension: Secondary | ICD-10-CM | POA: Insufficient documentation

## 2019-08-12 DIAGNOSIS — R5383 Other fatigue: Secondary | ICD-10-CM | POA: Insufficient documentation

## 2019-08-12 NOTE — Assessment & Plan Note (Signed)
She has no history of hypertension but has had several elevated readings.  She has been asked to check her pressures at home and submit readings for evaluation. Renal function and urine screen for proteinuria are normal   Lab Results  Component Value Date   CREATININE 0.71 08/10/2019   Lab Results  Component Value Date   CREATININE 0.71 08/10/2019   Lab Results  Component Value Date   MICROALBUR 0.7 08/10/2019   MICROALBUR 0.8 10/25/2016

## 2019-08-12 NOTE — Assessment & Plan Note (Addendum)
Mild, recent onset.  Screening labs pending.  No history of snoring.  Recommended participating in regular exercise program with goal of achieving a minimum of 30 minutes of aerobic activity 5 days per week.   Lab Results  Component Value Date   TSH 1.80 08/10/2019   Lab Results  Component Value Date   WBC 6.2 08/10/2019   HGB 13.6 08/10/2019   HCT 40.4 08/10/2019   MCV 95.1 08/10/2019   PLT 289 08/10/2019   Lab Results  Component Value Date   CREATININE 0.71 08/10/2019

## 2019-08-12 NOTE — Assessment & Plan Note (Signed)

## 2019-08-12 NOTE — Assessment & Plan Note (Signed)
Records requested from [place].  advised to continue avoiding lobster for now

## 2019-08-13 LAB — CBC WITH DIFFERENTIAL/PLATELET
Absolute Monocytes: 310 cells/uL (ref 200–950)
Basophils Absolute: 37 cells/uL (ref 0–200)
Basophils Relative: 0.6 %
Eosinophils Absolute: 130 cells/uL (ref 15–500)
Eosinophils Relative: 2.1 %
HCT: 40.4 % (ref 35.0–45.0)
Hemoglobin: 13.6 g/dL (ref 11.7–15.5)
Lymphs Abs: 1953 cells/uL (ref 850–3900)
MCH: 32 pg (ref 27.0–33.0)
MCHC: 33.7 g/dL (ref 32.0–36.0)
MCV: 95.1 fL (ref 80.0–100.0)
MPV: 12.1 fL (ref 7.5–12.5)
Monocytes Relative: 5 %
Neutro Abs: 3770 cells/uL (ref 1500–7800)
Neutrophils Relative %: 60.8 %
Platelets: 289 10*3/uL (ref 140–400)
RBC: 4.25 10*6/uL (ref 3.80–5.10)
RDW: 11.7 % (ref 11.0–15.0)
Total Lymphocyte: 31.5 %
WBC: 6.2 10*3/uL (ref 3.8–10.8)

## 2019-08-13 LAB — LIPID PANEL
Cholesterol: 223 mg/dL — ABNORMAL HIGH (ref <200)
HDL: 91 mg/dL (ref >=50)
LDL Cholesterol (Calc): 118 mg/dL (calc) — ABNORMAL HIGH
Non-HDL Cholesterol (Calc): 132 mg/dL (calc) — ABNORMAL HIGH (ref <130)
Total CHOL/HDL Ratio: 2.5 (calc) (ref <5.0)
Triglycerides: 57 mg/dL (ref <150)

## 2019-08-13 LAB — COMPREHENSIVE METABOLIC PANEL
AG Ratio: 2.4 (calc) (ref 1.0–2.5)
ALT: 18 U/L (ref 6–29)
AST: 22 U/L (ref 10–35)
Albumin: 4.8 g/dL (ref 3.6–5.1)
Alkaline phosphatase (APISO): 70 U/L (ref 37–153)
BUN: 16 mg/dL (ref 7–25)
CO2: 25 mmol/L (ref 20–32)
Calcium: 10 mg/dL (ref 8.6–10.4)
Chloride: 102 mmol/L (ref 98–110)
Creat: 0.71 mg/dL (ref 0.50–1.05)
Globulin: 2 g/dL (calc) (ref 1.9–3.7)
Glucose, Bld: 95 mg/dL (ref 65–99)
Potassium: 4 mmol/L (ref 3.5–5.3)
Sodium: 139 mmol/L (ref 135–146)
Total Bilirubin: 0.7 mg/dL (ref 0.2–1.2)
Total Protein: 6.8 g/dL (ref 6.1–8.1)

## 2019-08-13 LAB — TSH: TSH: 1.8 mIU/L (ref 0.40–4.50)

## 2019-08-13 LAB — SARS-COV-2 ANTIBODY(IGG)SPIKE,SEMI-QUANTITATIVE: SARS COV1 AB(IGG)SPIKE,SEMI QN: <1 index (ref <1.00)

## 2020-01-01 DIAGNOSIS — Z20822 Contact with and (suspected) exposure to covid-19: Secondary | ICD-10-CM | POA: Diagnosis not present

## 2020-02-27 DIAGNOSIS — Z20822 Contact with and (suspected) exposure to covid-19: Secondary | ICD-10-CM | POA: Diagnosis not present

## 2020-03-12 ENCOUNTER — Encounter: Payer: Self-pay | Admitting: Dermatology

## 2020-03-12 ENCOUNTER — Ambulatory Visit: Payer: BC Managed Care – PPO | Admitting: Dermatology

## 2020-03-12 ENCOUNTER — Other Ambulatory Visit: Payer: Self-pay

## 2020-03-12 DIAGNOSIS — L700 Acne vulgaris: Secondary | ICD-10-CM | POA: Diagnosis not present

## 2020-03-12 DIAGNOSIS — D229 Melanocytic nevi, unspecified: Secondary | ICD-10-CM | POA: Diagnosis not present

## 2020-03-12 DIAGNOSIS — L578 Other skin changes due to chronic exposure to nonionizing radiation: Secondary | ICD-10-CM

## 2020-03-12 DIAGNOSIS — L820 Inflamed seborrheic keratosis: Secondary | ICD-10-CM

## 2020-03-12 DIAGNOSIS — L853 Xerosis cutis: Secondary | ICD-10-CM | POA: Diagnosis not present

## 2020-03-12 DIAGNOSIS — L299 Pruritus, unspecified: Secondary | ICD-10-CM

## 2020-03-12 DIAGNOSIS — D1800 Hemangioma unspecified site: Secondary | ICD-10-CM

## 2020-03-12 DIAGNOSIS — Z86018 Personal history of other benign neoplasm: Secondary | ICD-10-CM

## 2020-03-12 DIAGNOSIS — L814 Other melanin hyperpigmentation: Secondary | ICD-10-CM

## 2020-03-12 DIAGNOSIS — L821 Other seborrheic keratosis: Secondary | ICD-10-CM

## 2020-03-12 NOTE — Progress Notes (Signed)
Follow-Up Visit   Subjective  Lori Farley is a 60 y.o. female who presents for the following: Follow-up.  Patient has had a bump on her chest for a while. She woke up Monday morning with it itching and irritated. She also has a spot on her right neck that has been treated with cryotherapy in the past, but always comes back. She had a biopsy proven SK of the right lateral neck in 2007. She has a history of dysplastic nevus of the left superior buttock. She would like her back checked today.  The following portions of the chart were reviewed this encounter and updated as appropriate:       Review of Systems:  No other skin or systemic complaints except as noted in HPI or Assessment and Plan.  Objective  Well appearing patient in no apparent distress; mood and affect are within normal limits.  A focused examination was performed including face, neck. Relevant physical exam findings are noted in the Assessment and Plan.  Objective  Left lower sternum: Waxy brown crusted papule with erythema 3 mm  Objective  left lower sternum: 5.8mm firm pink flesh papule- just came up a couple days ago, not bothersome  Objective  Back, abdomen: Xerotic patches with excoriations.   Assessment & Plan   Actinic Damage - chronic, secondary to cumulative UV radiation exposure/sun exposure over time - diffuse scaly erythematous macules with underlying dyspigmentation - Recommend daily broad spectrum sunscreen SPF 30+ to sun-exposed areas, reapply every 2 hours as needed.  - [facility] for new or changing lesions.  Melanocytic Nevi - Tan-brown and/or pink-flesh-colored symmetric macules and papules - Benign appearing on exam today - Observation - [facility] for new or changing moles - Recommend daily use of broad spectrum spf 30+ sunscreen to sun-exposed areas.   Hemangiomas - Red papules - Discussed benign nature - Observe - [facility] for any changes  Lentigines - Scattered tan macules -  Discussed due to sun exposure - Benign, observe - Recommend daily broad spectrum sunscreen SPF 30+ to sun-exposed areas, reapply every 2 hours as needed. - [facility] for any changes  History of Dysplastic Nevi - No evidence of recurrence today - Recommend regular full body skin exams - Recommend daily broad spectrum sunscreen SPF 30+ to sun-exposed areas, reapply every 2 hours as needed.  - [facility] if any new or changing lesions are noted between office visits  Seborrheic Keratoses - Stuck-on, waxy, tan-brown papules and plaques, including right neck. Not bothersome. - Discussed benign etiology and prognosis. - Observe - [facility] for any changes   Inflamed seborrheic keratosis Left lower sternum  Destruction of lesion - Left lower sternum  Destruction method: cryotherapy   Informed consent: discussed and consent obtained   Lesion destroyed using liquid nitrogen: Yes   Region frozen until ice ball extended beyond lesion: Yes   Outcome: patient tolerated procedure well with no complications   Post-procedure details: wound care instructions given    Cystic acne left lower sternum  Neutrogena BP Spot treat - sample given. Risk bleaching.  Xerosis cutis Back, abdomen  With pruritus  Recommend mild soap and moisturizing cream 1-2 times daily.  Recommend CeraVe Itch Relief cream prn  Discussed topical steroid cream for itch, patient defers today.    Return as scheduled.   IJamesetta Orleans, CMA, am acting as scribe for Brendolyn Patty, MD .  Documentation: I have reviewed the above documentation for accuracy and completeness, and I agree with the above.  Brendolyn Patty MD

## 2020-03-12 NOTE — Patient Instructions (Addendum)
Cryotherapy Aftercare  . Wash gently with soap and water everyday.   Marland Kitchen Apply Vaseline and Band-Aid daily until healed.   Dry Skin Care  What causes dry skin?  Dry skin is common and results from inadequate moisture in the outer skin layers. Dry skin usually results from the excessive loss of moisture from the skin surface. This occurs due to two major factors: 1. Normally the skin's oil glands deposit a layer of oil on the skin's surface. This layer of oil prevents the loss of moisture from the skin. Exposure to soaps, cleaners, solvents, and disinfectants removes this oily film, allowing water to escape. 2. Water loss from the skin increases when the humidity is low. During winter months we spend a lot of time indoors where the air is heated. Heated air has very low humidity. This also contributes to dry skin.  A tendency for dry skin may accompany such disorders as eczema. Also, as people age, the number of functioning oil glands decreases, and the tendency toward dry skin can be a sensation of skin tightness when emerging from the shower.  How do I manage dry skin?  1. Humidify your environment. This can be accomplished by using a humidifier in your bedroom at night during winter months. 2. Bathing can actually put moisture back into your skin if done right. Take the following steps while bathing to sooth dry skin:  Avoid hot water, which only dries the skin and makes itching worse. Use warm water.  Avoid washcloths or extensive rubbing or scrubbing.  Use mild soaps like unscented Dove, Oil of Olay, Cetaphil, Basis, or CeraVe.  If you take baths rather than showers, rinse off soap residue with clean water before getting out of tub.  Once out of the shower/tub, pat dry gently with a soft towel. Leave your skin damp.  While still damp, apply any medicated ointment/cream you were prescribed to the affected areas. After you apply your medicated ointment/cream, then apply your  moisturizer to your whole body.This is the most important step in dry skin care. If this is omitted, your skin will continue to be dry.  The choice of moisturizer is also very important. In general, lotion will not provider enough moisture to severely dry skin because it is water based. You should use an ointment or cream. Moisturizers should also be unscented. Good choices include Vaseline (plain petrolatum), Aquaphor, Cetaphil, CeraVe, Vanicream, DML Forte, Aveeno moisture, or Eucerin Cream.  Bath oils can be helpful, but do not replace the application of moisturizer after the bath. In addition, they make the tub slippery causing an increased risk for falls. Therefore, we do not recommend their use.   Seborrheic Keratosis  What causes seborrheic keratoses? Seborrheic keratoses are harmless, common skin growths that first appear during adult life.  As time goes by, more growths appear.  Some people may develop a large number of them.  Seborrheic keratoses appear on both covered and uncovered body parts.  They are not caused by sunlight.  The tendency to develop seborrheic keratoses can be inherited.  They vary in color from skin-colored to gray, brown, or even black.  They can be either smooth or have a rough, warty surface.   Seborrheic keratoses are superficial and look as if they were stuck on the skin.  Under the microscope this type of keratosis looks like layers upon layers of skin.  That is why at times the top layer may seem to fall off, but the rest of the  growth remains and re-grows.    Treatment Seborrheic keratoses do not need to be treated, but can easily be removed in the office.  Seborrheic keratoses often cause symptoms when they rub on clothing or jewelry.  Lesions can be in the way of shaving.  If they become inflamed, they can cause itching, soreness, or burning.  Removal of a seborrheic keratosis can be accomplished by freezing, burning, or surgery. If any spot bleeds, scabs, or  grows rapidly, please return to have it checked, as these can be an indication of a skin cancer.

## 2020-03-13 ENCOUNTER — Other Ambulatory Visit: Payer: Self-pay | Admitting: Obstetrics and Gynecology

## 2020-03-13 DIAGNOSIS — Z1231 Encounter for screening mammogram for malignant neoplasm of breast: Secondary | ICD-10-CM | POA: Diagnosis not present

## 2020-03-13 DIAGNOSIS — Z01419 Encounter for gynecological examination (general) (routine) without abnormal findings: Secondary | ICD-10-CM | POA: Diagnosis not present

## 2020-04-03 ENCOUNTER — Other Ambulatory Visit: Payer: Self-pay

## 2020-04-03 ENCOUNTER — Ambulatory Visit
Admission: RE | Admit: 2020-04-03 | Discharge: 2020-04-03 | Disposition: A | Discharge status: Home / Self Care | Payer: BC Managed Care – PPO | Source: Ambulatory Visit | Attending: Obstetrics and Gynecology | Admitting: Obstetrics and Gynecology

## 2020-04-03 DIAGNOSIS — Z1231 Encounter for screening mammogram for malignant neoplasm of breast: Secondary | ICD-10-CM | POA: Diagnosis not present

## 2020-05-01 ENCOUNTER — Telehealth: Payer: Self-pay | Admitting: Internal Medicine

## 2020-05-01 NOTE — Telephone Encounter (Signed)
Pt called and wanted to discuss a letter that she needs to travel since she has had covid in the last 90 days

## 2020-05-02 NOTE — Telephone Encounter (Signed)
Pt called back and said that she was tested for Covid on 02/27/20 test and got the results back on 02/28/20

## 2020-05-02 NOTE — Telephone Encounter (Signed)
I don't understand why [employer] is requiring this type of documentation letter in a patient who was infected more than 2 months prior to their flight.   If her covid test is going to be positive because of the recent infection,  Then she will need to be seen bc  I will not write a letter without examining the patient.   I can not  start a precedent because  If every patient in this situation requires a letter from me,  I will not be doing anything else!

## 2020-05-02 NOTE — Telephone Encounter (Signed)
Spoke with pt and she stated that her and her husband are traveling to [place] April 23rd to April 30th on [employer]. Pt stated that she will need a letter stating that she is safe to travel and has fully recovered from having covid in February. Pt was diagnosed with covid on 02/29/2020. Pt is aware that you are out of the office until Monday and will not be able to get the letter until sometime next week. Pt gave a verbal understanding.

## 2020-05-02 NOTE — Telephone Encounter (Signed)
Correction of dates

## 2020-05-06 NOTE — Telephone Encounter (Signed)
LMTCB

## 2020-05-08 NOTE — Telephone Encounter (Signed)
PT called in regards to the covid letter to followup

## 2020-05-08 NOTE — Telephone Encounter (Signed)
See my chart message

## 2020-05-28 ENCOUNTER — Ambulatory Visit: Payer: 59 | Admitting: Dermatology

## 2020-09-03 ENCOUNTER — Ambulatory Visit: Payer: BC Managed Care – PPO | Admitting: Internal Medicine

## 2020-09-03 ENCOUNTER — Encounter: Payer: Self-pay | Admitting: Internal Medicine

## 2020-09-03 ENCOUNTER — Other Ambulatory Visit: Payer: Self-pay

## 2020-09-03 VITALS — BP 142/80 | HR 115 | Temp 97.1°F | Ht 63.5 in | Wt 131.6 lb

## 2020-09-03 DIAGNOSIS — R5383 Other fatigue: Secondary | ICD-10-CM | POA: Diagnosis not present

## 2020-09-03 DIAGNOSIS — Z91018 Allergy to other foods: Secondary | ICD-10-CM

## 2020-09-03 DIAGNOSIS — Z113 Encounter for screening for infections with a predominantly sexual mode of transmission: Secondary | ICD-10-CM

## 2020-09-03 DIAGNOSIS — R030 Elevated blood-pressure reading, without diagnosis of hypertension: Secondary | ICD-10-CM

## 2020-09-03 DIAGNOSIS — T7840XS Allergy, unspecified, sequela: Secondary | ICD-10-CM | POA: Diagnosis not present

## 2020-09-03 NOTE — Progress Notes (Signed)
Subjective:  Patient ID: Lori Farley, female    DOB: August 11, 1960  Age: 59 y.o. MRN: [id number]  CC: The primary encounter diagnosis was Fatigue, unspecified type. Diagnoses of Screen for STD (sexually transmitted disease), Allergic reaction, sequela, Classic IgE mediated food allergy, and Elevated blood pressure reading without diagnosis of hypertension were also pertinent to this visit.  HPI Lori Farley presents for allergy testing.   This visit occurred during the SARS-CoV-2 public health emergency.  Safety protocols were in place, including screening questions prior to the visit, additional usage of staff PPE, and extensive cleaning of exam room while observing appropriate contact time as indicated for disinfecting solutions.    Was evaluated by Dr Donneta Romberg in Dec 2018 for suspected food allergies after having a reaction to eating shellfish and champagne.  She was given an Epi pen. Prick testing was done and suggsted trees,  dogs,  cats,  etc  (per patient )  but shellfish was either not tested or negative.  No results  available.  She was either not advised or decided not to start therapy for histamine blockade.  Has been avoiding shellfish, which she would like to stop doing unless there is clear evidence of a shellfish allergy, since she never had a reaction to shellfish until  4 or 5 years ago. Recent episodes described,  which occur several hours after after drinking champagne and eating shrimp :  skin gets red,,  chest gets  tight .    For the last episode she took children's benadryl .  She has not used the epi pen, has never developed lip or tongue swelling,  trouble breathing,  or low blood pressure   No outpatient medications prior to visit.   No facility-administered medications prior to visit.    Review of Systems;  Patient denies headache, fevers, malaise, unintentional weight loss, skin rash, eye pain, sinus congestion and sinus pain, sore throat, dysphagia,  hemoptysis ,  cough, dyspnea, wheezing, chest pain, palpitations, orthopnea,  abdominal pain, nausea, melena, diarrhea, constipation, flank pain, dysuria, hematuria, urinary  Frequency, nocturia, numbness, tingling, seizures,  Focal weakness, Loss of consciousness,  Tremor, insomnia, depression, anxiety, and suicidal ideation.      Objective:  BP (!) 142/80   Pulse (!) 115   Temp (!) 97.1 F (36.2 C) (Skin)   Ht 5' 3.5" (1.613 m)   Wt 131 lb 9.6 oz (59.7 kg)   LMP 11/03/2015   SpO2 98%   BMI 22.95 kg/m   BP Readings from Last 3 Encounters:  09/03/20 (!) 142/80  08/10/19 (!) 142/70  02/28/17 138/76    Wt Readings from Last 3 Encounters:  09/03/20 131 lb 9.6 oz (59.7 kg)  08/10/19 129 lb 3.2 oz (58.6 kg)  02/28/17 130 lb 12.8 oz (59.3 kg)    General appearance: alert, cooperative and appears stated age Ears: normal TM's and external ear canals both ears Throat: lips, mucosa, and tongue normal; teeth and gums normal Neck: no adenopathy, no carotid bruit, supple, symmetrical, trachea midline and thyroid not enlarged, symmetric, no tenderness/mass/nodules Back: symmetric, no curvature. ROM normal. No CVA tenderness. Lungs: clear to auscultation bilaterally Heart: regular rate and rhythm, S1, S2 normal, no murmur, click, rub or gallop Abdomen: soft, non-tender; bowel sounds normal; no masses,  no organomegaly Pulses: 2+ and symmetric Skin: Skin color, texture, turgor normal. No rashes or lesions Lymph nodes: Cervical, supraclavicular, and axillary nodes normal.  No results found for: HGBA1C  Lab Results  Component Value  Date   CREATININE 0.70 09/04/2020   CREATININE 0.71 08/10/2019   CREATININE 0.58 10/25/2016    Lab Results  Component Value Date   WBC 6.2 08/10/2019   HGB 13.6 08/10/2019   HCT 40.4 08/10/2019   PLT 289 08/10/2019   GLUCOSE 103 (H) 09/04/2020   CHOL 223 (H) 08/10/2019   TRIG 57 08/10/2019   HDL 91 08/10/2019   LDLCALC 118 (H) 08/10/2019   ALT 28 09/04/2020    AST 35 09/04/2020   NA 138 09/04/2020   K 3.8 09/04/2020   CL 101 09/04/2020   CREATININE 0.70 09/04/2020   BUN 13 09/04/2020   CO2 29 09/04/2020   TSH 1.32 09/04/2020   MICROALBUR 0.7 08/10/2019    MM 3D SCREEN BREAST BILATERAL  Result Date: 04/07/2020 CLINICAL DATA:  Screening. EXAM: DIGITAL SCREENING BILATERAL MAMMOGRAM WITH TOMOSYNTHESIS AND CAD TECHNIQUE: Bilateral screening digital craniocaudal and mediolateral oblique mammograms were obtained. Bilateral screening digital breast tomosynthesis was performed. The images were evaluated with computer-aided detection. COMPARISON:  Previous exam(s). ACR Breast Density Category b: There are scattered areas of fibroglandular density. FINDINGS: There are no findings suspicious for malignancy. The images were evaluated with computer-aided detection. IMPRESSION: No mammographic evidence of malignancy. A result letter of this screening mammogram will be mailed directly to the patient. RECOMMENDATION: Screening mammogram in one year. (Code:SM-B-01Y) BI-RADS CATEGORY  1: Negative. Electronically Signed   By: Lillia Mountain M.D.   On: 04/07/2020 10:44    Assessment & Plan:   Problem List Items Addressed This Visit       Unprioritized   Classic IgE mediated food allergy    Records reviewed  from [place].  She was advised to continue avoiding lobster and other shellfish but no definitive allergies have been noted.  She has been advised that, given her mild symptoms,  She should consider daily use of a 2nd generation antihistamine and twice daily use of famotidine,  And keep her EPI pen up to date.       Elevated blood pressure reading without diagnosis of hypertension    She has no history of hypertension but has hadl elevated readings for the last 2 yearly visits. .  She has been asked to check her pressures at home and submit readings for evaluation but has not done so.  I have reviewed records from [facility] and BP reading during her  well woman exam was 124/76.  Marland Kitchen Renal function and urine screen for proteinuria have been repeatedly  normal   Lab Results  Component Value Date   CREATININE 0.70 09/04/2020   Lab Results  Component Value Date   CREATININE 0.70 09/04/2020   Lab Results  Component Value Date   MICROALBUR 0.7 08/10/2019   MICROALBUR 0.8 10/25/2016           Fatigue - Primary   Relevant Orders   Comprehensive metabolic panel (Completed)   TSH (Completed)   Other Visit Diagnoses     Screen for STD (sexually transmitted disease)       Relevant Orders   Hepatitis C antibody (Completed)   HIV Antibody (routine testing w rflx) (Completed)   Allergic reaction, sequela       Relevant Orders   Allergen Profile, Shellfish      I provided  30 minutes  Was spent  reviewing patient's current problem and rprevious encounters with his specialists, reviewing and ordering  labs and imaging studies, providing counseling on the food allergy she appears to be  having,  either to shellfish or to wine ( or potentially the combination) , and evaluating patient  In a face to face visit  .  There are no discontinued medications.  Follow-up: No follow-ups on file.   Crecencio Mc, MD

## 2020-09-03 NOTE — Patient Instructions (Signed)
  I recommend taking a once daily antihistamine (claritin,  zyrtec or allegra ) along with 20 mg famotidine 1 hour before any meal containing shellfish and/or wine/champagne  All antihistamines are OTC and generic (cheap at [employer]):  generic zyrtec, which is cetirizine.   Allegra is availabel generically as fexofenadine  claritin is also available generically as loratidine .    Famotidine is an H2 blocker (AKA pepcid)

## 2020-09-04 ENCOUNTER — Other Ambulatory Visit: Payer: Self-pay

## 2020-09-04 ENCOUNTER — Other Ambulatory Visit (INDEPENDENT_AMBULATORY_CARE_PROVIDER_SITE_OTHER): Payer: BC Managed Care – PPO

## 2020-09-04 DIAGNOSIS — T7840XS Allergy, unspecified, sequela: Secondary | ICD-10-CM

## 2020-09-04 DIAGNOSIS — Z113 Encounter for screening for infections with a predominantly sexual mode of transmission: Secondary | ICD-10-CM

## 2020-09-04 DIAGNOSIS — Z91018 Allergy to other foods: Secondary | ICD-10-CM

## 2020-09-04 DIAGNOSIS — R5383 Other fatigue: Secondary | ICD-10-CM | POA: Diagnosis not present

## 2020-09-04 LAB — COMPREHENSIVE METABOLIC PANEL
ALT: 28 U/L (ref 0–35)
AST: 35 U/L (ref 0–37)
Albumin: 4.5 g/dL (ref 3.5–5.2)
Alkaline Phosphatase: 100 U/L (ref 39–117)
BUN: 13 mg/dL (ref 6–23)
CO2: 29 mEq/L (ref 19–32)
Calcium: 9.9 mg/dL (ref 8.4–10.5)
Chloride: 101 mEq/L (ref 96–112)
Creatinine, Ser: 0.7 mg/dL (ref 0.40–1.20)
GFR: 95.63 mL/min (ref >60.00)
Glucose, Bld: 103 mg/dL — ABNORMAL HIGH (ref 70–99)
Potassium: 3.8 mEq/L (ref 3.5–5.1)
Sodium: 138 mEq/L (ref 135–145)
Total Bilirubin: 0.5 mg/dL (ref 0.2–1.2)
Total Protein: 6.9 g/dL (ref 6.0–8.3)

## 2020-09-04 LAB — TSH: TSH: 1.32 u[IU]/mL (ref 0.35–5.50)

## 2020-09-05 LAB — HEPATITIS C ANTIBODY
Hepatitis C Ab: NONREACTIVE
SIGNAL TO CUT-OFF: 0.01 (ref <1.00)

## 2020-09-05 LAB — HIV ANTIBODY (ROUTINE TESTING W REFLEX): HIV 1&2 Ab, 4th Generation: NONREACTIVE

## 2020-09-06 NOTE — Assessment & Plan Note (Signed)
Records reviewed  from [place].  She was advised to continue avoiding lobster and other shellfish but no definitive allergies have been noted.  She has been advised that, given her mild symptoms,  She should consider daily use of a 2nd generation antihistamine and twice daily use of famotidine,  And keep her EPI pen up to date.

## 2020-09-06 NOTE — Assessment & Plan Note (Addendum)
She has no history of hypertension but has hadl elevated readings for the last 2 yearly visits. .  She has been asked to check her pressures at home and submit readings for evaluation but has not done so.  I have reviewed records from [facility] and BP reading during her well woman exam was 124/76.  Marland Kitchen Renal function and urine screen for proteinuria have been repeatedly  normal   Lab Results  Component Value Date   CREATININE 0.70 09/04/2020   Lab Results  Component Value Date   CREATININE 0.70 09/04/2020   Lab Results  Component Value Date   MICROALBUR 0.7 08/10/2019   MICROALBUR 0.8 10/25/2016

## 2020-09-08 LAB — ALLERGEN PROFILE, SHELLFISH
Clam IgE: <0.1 kU/L
F023-IgE Crab: <0.1 kU/L
F080-IgE Lobster: <0.1 kU/L
F290-IgE Oyster: <0.1 kU/L
Scallop IgE: <0.1 kU/L
Shrimp IgE: <0.1 kU/L

## 2020-10-15 DIAGNOSIS — S0511XA Contusion of eyeball and orbital tissues, right eye, initial encounter: Secondary | ICD-10-CM | POA: Diagnosis not present

## 2020-11-26 ENCOUNTER — Ambulatory Visit: Payer: BC Managed Care – PPO | Admitting: Dermatology

## 2021-01-07 ENCOUNTER — Ambulatory Visit: Payer: BC Managed Care – PPO | Admitting: Dermatology

## 2021-01-07 ENCOUNTER — Other Ambulatory Visit: Payer: Self-pay

## 2021-01-07 DIAGNOSIS — Z86018 Personal history of other benign neoplasm: Secondary | ICD-10-CM

## 2021-01-07 DIAGNOSIS — L820 Inflamed seborrheic keratosis: Secondary | ICD-10-CM

## 2021-01-07 DIAGNOSIS — L719 Rosacea, unspecified: Secondary | ICD-10-CM | POA: Diagnosis not present

## 2021-01-07 DIAGNOSIS — D1801 Hemangioma of skin and subcutaneous tissue: Secondary | ICD-10-CM

## 2021-01-07 DIAGNOSIS — I781 Nevus, non-neoplastic: Secondary | ICD-10-CM

## 2021-01-07 DIAGNOSIS — Z1283 Encounter for screening for malignant neoplasm of skin: Secondary | ICD-10-CM

## 2021-01-07 DIAGNOSIS — L578 Other skin changes due to chronic exposure to nonionizing radiation: Secondary | ICD-10-CM

## 2021-01-07 DIAGNOSIS — Z872 Personal history of diseases of the skin and subcutaneous tissue: Secondary | ICD-10-CM

## 2021-01-07 DIAGNOSIS — D229 Melanocytic nevi, unspecified: Secondary | ICD-10-CM

## 2021-01-07 DIAGNOSIS — L570 Actinic keratosis: Secondary | ICD-10-CM

## 2021-01-07 DIAGNOSIS — L738 Other specified follicular disorders: Secondary | ICD-10-CM

## 2021-01-07 NOTE — Progress Notes (Signed)
Follow-Up Visit   Subjective  Lori Farley is a 60 y.o. female who presents for the following: TBSE (Pt has a few spots to check today. Hx of a mild dysplastic nevus in 2002 and AK on chest that was treated in office with Ln2 in 2021. ).  Spot on R neck have been removed in past and has come back.  Sometimes itchy.  Patient here for full body skin exam and skin cancer screening. The patient has spots, moles and lesions to be evaluated, some may be new or changing and the patient has concerns that these could be cancer.    The following portions of the chart were reviewed this encounter and updated as appropriate:      Review of Systems: No other skin or systemic complaints except as noted in HPI or Assessment and Plan.   Objective  Well appearing patient in no apparent distress; mood and affect are within normal limits.  A full examination was performed including scalp, head, eyes, ears, nose, lips, neck, chest, axillae, abdomen, back, buttocks, bilateral upper extremities, bilateral lower extremities, hands, feet, fingers, toes, fingernails, and toenails. All findings within normal limits unless otherwise noted below.  right ant thigh x 1, left anterior thigh x 2, lower sternum x 1 (4) Keratotic macules thighs, Pink macule chest in area of previously treated AK  left jaw, right neck Erythematous keratotic or waxy stuck-on papule    right nasal root Red papule   Head - Anterior (Face) Mid face erythema with telangiectasias, denies getting bumps  Assessment & Plan  AK (actinic keratosis) (4) right ant thigh x 1, left anterior thigh x 2, lower sternum x 1  Hypertrophic AK (thighs), recurrent (chest)  Actinic keratoses are precancerous spots that appear secondary to cumulative UV radiation exposure/sun exposure over time. They are chronic with expected duration over 1 year. A portion of actinic keratoses will progress to squamous cell carcinoma of the skin. It is not  possible to reliably predict which spots will progress to skin cancer and so treatment is recommended to prevent development of skin cancer.  Recommend daily broad spectrum sunscreen SPF 30+ to sun-exposed areas, reapply every 2 hours as needed.  Recommend staying in the shade or wearing long sleeves, sun glasses (UVA+UVB protection) and wide brim hats (4-inch brim around the entire circumference of the hat). Call for new or changing lesions.  Prior to procedure, discussed risks of blister formation, small wound, skin dyspigmentation, or rare scar following cryotherapy. Recommend Vaseline ointment to treated areas while healing.   Destruction of lesion - right ant thigh x 1, left anterior thigh x 2, lower sternum x 1  Destruction method: cryotherapy   Informed consent: discussed and consent obtained   Lesion destroyed using liquid nitrogen: Yes   Region frozen until ice ball extended beyond lesion: Yes   Outcome: patient tolerated procedure well with no complications   Post-procedure details: wound care instructions given    Inflamed seborrheic keratosis left jaw, right neck  Pt defers treatment today  Benign, observe.     Hemangioma of skin right nasal root  Benign, observe.    Rosacea Head - Anterior (Face)  Mild Rosacea is a chronic progressive skin condition usually affecting the face of adults, causing redness and/or acne bumps. It is treatable but not curable. It sometimes affects the eyes (ocular rosacea) as well. It may respond to topical and/or systemic medication and can flare with stress, sun exposure, alcohol, exercise and some foods.  Daily application of broad spectrum spf 30+ sunscreen to face is recommended to reduce flares.   Discussed multiple treatment options, topical vrs laser (BBL). Pt defers treatment at this time.  Seborrheic Keratoses - Stuck-on, waxy, tan-brown papules and/or plaques  - Benign-appearing - Discussed benign etiology and prognosis. -  Observe - Call for any changes  Actinic Damage - chronic, secondary to cumulative UV radiation exposure/sun exposure over time - diffuse scaly erythematous macules with underlying dyspigmentation - Recommend daily broad spectrum sunscreen SPF 30+ to sun-exposed areas, reapply every 2 hours as needed.  - Recommend staying in the shade or wearing long sleeves, sun glasses (UVA+UVB protection) and wide brim hats (4-inch brim around the entire circumference of the hat). - Call for new or changing lesions.  Sebaceous Hyperplasia - Small yellow papules with a central dell - Benign - Observe  Telangiectasia Malar cheeks and nose - Dilated blood vessel - Benign appearing on exam - Call for changes  Melanocytic Nevi - Tan-brown and/or pink-flesh-colored symmetric macules and papules - Benign appearing on exam today - Observation - Call clinic for new or changing moles - Recommend daily use of broad spectrum spf 30+ sunscreen to sun-exposed areas.   Lentigines - Scattered tan macules - Due to sun exposure - Benign-appering, observe - Recommend daily broad spectrum sunscreen SPF 30+ to sun-exposed areas, reapply every 2 hours as needed. - Call for any changes  Hemangiomas - Red papules - Discussed benign nature - Observe - Call for any changes  Skin cancer screening performed today.  History of Dysplastic Nevi - No evidence of recurrence today - Recommend regular full body skin exams - Recommend daily broad spectrum sunscreen SPF 30+ to sun-exposed areas, reapply every 2 hours as needed.  - Call if any new or changing lesions are noted between office visits   Return if symptoms worsen or fail to improve.  I, Harriett Sine, CMA, am acting as scribe for Brendolyn Patty, MD.  Documentation: I have reviewed the above documentation for accuracy and completeness, and I agree with the above.  Brendolyn Patty MD

## 2021-01-07 NOTE — Patient Instructions (Addendum)
Cryotherapy Aftercare  Wash gently with soap and water everyday.   Apply Vaseline and Band-Aid daily until healed.    Seborrheic Keratosis  What causes seborrheic keratoses? Seborrheic keratoses are harmless, common skin growths that first appear during adult life.  As time goes by, more growths appear.  Some people may develop a large number of them.  Seborrheic keratoses appear on both covered and uncovered body parts.  They are not caused by sunlight.  The tendency to develop seborrheic keratoses can be inherited.  They vary in color from skin-colored to gray, brown, or even black.  They can be either smooth or have a rough, warty surface.   Seborrheic keratoses are superficial and look as if they were stuck on the skin.  Under the microscope this type of keratosis looks like layers upon layers of skin.  That is why at times the top layer may seem to fall off, but the rest of the growth remains and re-grows.    Treatment Seborrheic keratoses do not need to be treated, but can easily be removed in the office.  Seborrheic keratoses often cause symptoms when they rub on clothing or jewelry.  Lesions can be in the way of shaving.  If they become inflamed, they can cause itching, soreness, or burning.  Removal of a seborrheic keratosis can be accomplished by freezing, burning, or surgery. If any spot bleeds, scabs, or grows rapidly, please return to have it checked, as these can be an indication of a skin cancer.    If You Need Anything After Your Visit  If you have any questions or concerns for your doctor, please call our main line at [phone] and press option 4 to reach your doctor's medical assistant. If no one answers, please leave a voicemail as directed and we will return your call as soon as possible. Messages left after 4 pm will be answered the following business day.   You may also send [place] a message via [place]. We typically respond to MyChart messages within 1-2 business  days.  For prescription refills, please ask your pharmacy to contact our office. Our fax number is [phone].  If you have an urgent issue when the clinic is closed that cannot wait until the next business day, you can page your doctor at the number below.    Please note that while we do our best to be available for urgent issues outside of office hours, we are not available 24/7.   If you have an urgent issue and are unable to reach [place], you may choose to seek medical care at your doctor's office, retail clinic, urgent care center, or emergency room.  If you have a medical emergency, please immediately call 911 or go to the emergency department.  Pager Numbers  - Dr. Nehemiah Massed: [phone]  - Dr. Laurence Ferrari: [phone]  - Dr. Nicole Kindred: [phone]  In the event of inclement weather, please call our main line at [phone] for an update on the status of any delays or closures.  Dermatology Medication Tips: Please keep the boxes that topical medications come in in order to help keep track of the instructions about where and how to use these. Pharmacies typically print the medication instructions only on the boxes and not directly on the medication tubes.   If your medication is too expensive, please contact our office at [phone] option 4 or send [place] a message through [place].   We are unable to tell what your co-pay for medications will be in  advance as this is different depending on your insurance coverage. However, we may be able to find a substitute medication at lower cost or fill out paperwork to get insurance to cover a needed medication.   If a prior authorization is required to get your medication covered by your insurance company, please allow [place] 1-2 business days to complete this process.  Drug prices often vary depending on where the prescription is filled and some pharmacies may offer cheaper prices.  The website www.goodrx.com contains coupons for medications through  different pharmacies. The prices here do not account for what the cost may be with help from insurance (it may be cheaper with your insurance), but the website can give you the price if you did not use any insurance.  - You can print the associated coupon and take it with your prescription to the pharmacy.  - You may also stop by our office during regular business hours and pick up a GoodRx coupon card.  - If you need your prescription sent electronically to a different pharmacy, notify our office through [facility] or by phone at [phone] option 4.     Si Usted Necesita Algo Despus de Su Visita  Tambin puede enviarnos un mensaje a travs de Pharmacist, community. Por lo general respondemos a los mensajes de MyChart en el transcurso de 1 a 2 das hbiles.  Para renovar recetas, por favor pida a su farmacia que se ponga en contacto con nuestra oficina. Harland Dingwall de fax es [place] [phone].  Si tiene un asunto urgente cuando la clnica est cerrada y que no puede esperar hasta el siguiente da hbil, puede llamar/localizar a su doctor(a) al nmero que aparece a continuacin.   Por favor, tenga en cuenta que aunque hacemos todo lo posible para estar disponibles para asuntos urgentes fuera del horario de [place], no estamos disponibles las 24 horas del da, los 7 das de la [place].   Si tiene un problema urgente y no puede comunicarse con nosotros, puede optar por buscar atencin mdica  en el consultorio de su doctor(a), en una clnica privada, en un centro de atencin urgente o en una sala de emergencias.  Si tiene Engineering geologist, por favor llame inmediatamente al 911 o vaya a la sala de emergencias.  Nmeros de bper  - Dr. Nehemiah Massed: [phone]  - Dra. Moye: [phone]  - Dra. Nicole Kindred: [phone]  En caso de inclemencias del [place], por favor llame a Johnsie Kindred principal al [phone] para una actualizacin sobre el [place] de cualquier retraso o cierre.  Consejos  para la medicacin en dermatologa: Por favor, guarde las cajas en las que vienen los medicamentos de uso tpico para ayudarle a seguir las instrucciones sobre dnde y cmo usarlos. Las farmacias generalmente imprimen las instrucciones del medicamento slo en las cajas y no directamente en los tubos del [place].   Si su medicamento es muy caro, por favor, pngase en contacto con Zigmund Daniel llamando al [phone] y presione la opcin 4 o envenos un mensaje a travs de Pharmacist, community.   No podemos decirle cul ser su copago por los medicamentos por adelantado ya que esto es diferente dependiendo de la cobertura de su seguro. Sin embargo, es posible que podamos encontrar un medicamento sustituto a Electrical engineer un formulario para que el seguro cubra el medicamento que se considera necesario.   Si se requiere una autorizacin previa para que su compaa de seguros [place] su medicamento, por favor permtanos de 1 a 2  das hbiles para completar [place].  Los precios de los medicamentos varan con frecuencia dependiendo del Environmental consultant de dnde se surte la receta y alguna farmacias pueden ofrecer precios ms baratos.  El sitio web www.goodrx.com tiene cupones para medicamentos de Airline pilot. Los precios aqu no tienen en cuenta lo que podra costar con la ayuda del seguro (puede ser ms barato con su seguro), pero el sitio web puede darle el precio si no utiliz Research scientist (physical sciences).  - Puede imprimir el cupn correspondiente y llevarlo con su receta a la farmacia.  - Tambin puede pasar por nuestra oficina durante el horario de atencin regular y Charity fundraiser una tarjeta de cupones de GoodRx.  - Si necesita que su receta se enve electrnicamente a una farmacia diferente, informe a nuestra oficina a travs de MyChart de [place] o por telfono llamando al [phone] y presione la opcin 4.

## 2021-01-14 ENCOUNTER — Ambulatory Visit: Payer: BC Managed Care – PPO | Admitting: Dermatology

## 2021-03-13 LAB — HM PAP SMEAR: HM Pap smear: NORMAL

## 2021-05-27 ENCOUNTER — Other Ambulatory Visit: Payer: Self-pay | Admitting: Obstetrics and Gynecology

## 2021-05-27 DIAGNOSIS — Z01419 Encounter for gynecological examination (general) (routine) without abnormal findings: Secondary | ICD-10-CM | POA: Diagnosis not present

## 2021-05-27 DIAGNOSIS — Z1231 Encounter for screening mammogram for malignant neoplasm of breast: Secondary | ICD-10-CM

## 2021-06-17 ENCOUNTER — Ambulatory Visit
Admission: RE | Admit: 2021-06-17 | Discharge: 2021-06-17 | Disposition: A | Discharge status: Home / Self Care | Payer: BC Managed Care – PPO | Source: Ambulatory Visit | Attending: Obstetrics and Gynecology | Admitting: Obstetrics and Gynecology

## 2021-06-17 DIAGNOSIS — Z1231 Encounter for screening mammogram for malignant neoplasm of breast: Secondary | ICD-10-CM | POA: Insufficient documentation

## 2021-07-07 DIAGNOSIS — S63522A Sprain of radiocarpal joint of left wrist, initial encounter: Secondary | ICD-10-CM | POA: Diagnosis not present

## 2021-07-16 IMAGING — MG MM DIGITAL SCREENING BILAT W/ TOMO AND CAD
8 series · 8 of 24 positions shown · non-contrast
Comparison: Previous exam(s).

CLINICAL DATA: Screening.

EXAM:
DIGITAL SCREENING BILATERAL MAMMOGRAM WITH TOMOSYNTHESIS AND CAD
TECHNIQUE: Bilateral screening digital craniocaudal and mediolateral oblique
mammograms were obtained. Bilateral screening digital breast
tomosynthesis was performed. The images were evaluated with
computer-aided detection.

[R MLO synth-2D]
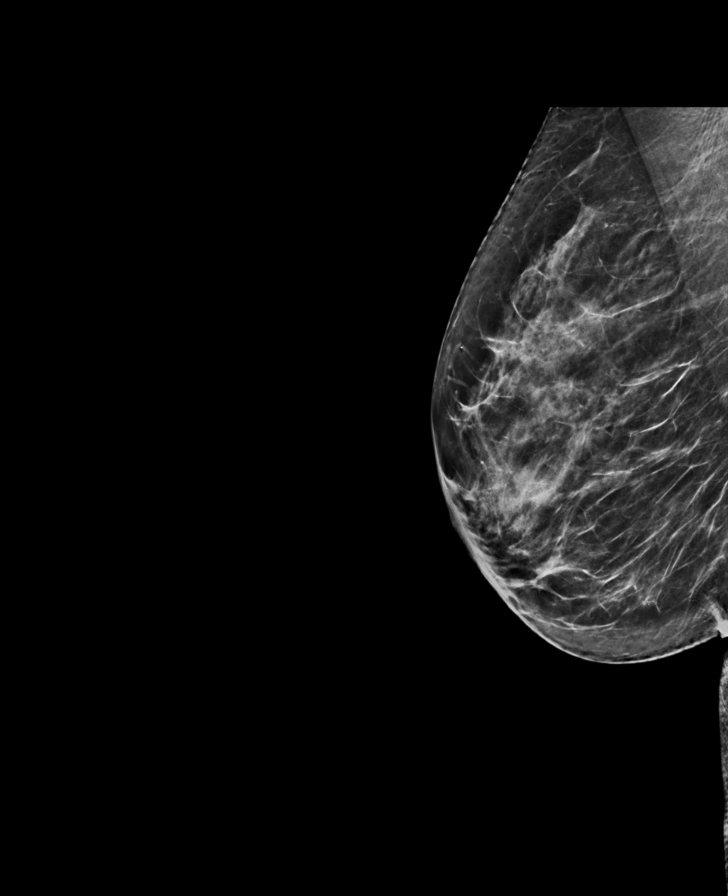

[L CC synth-2D]
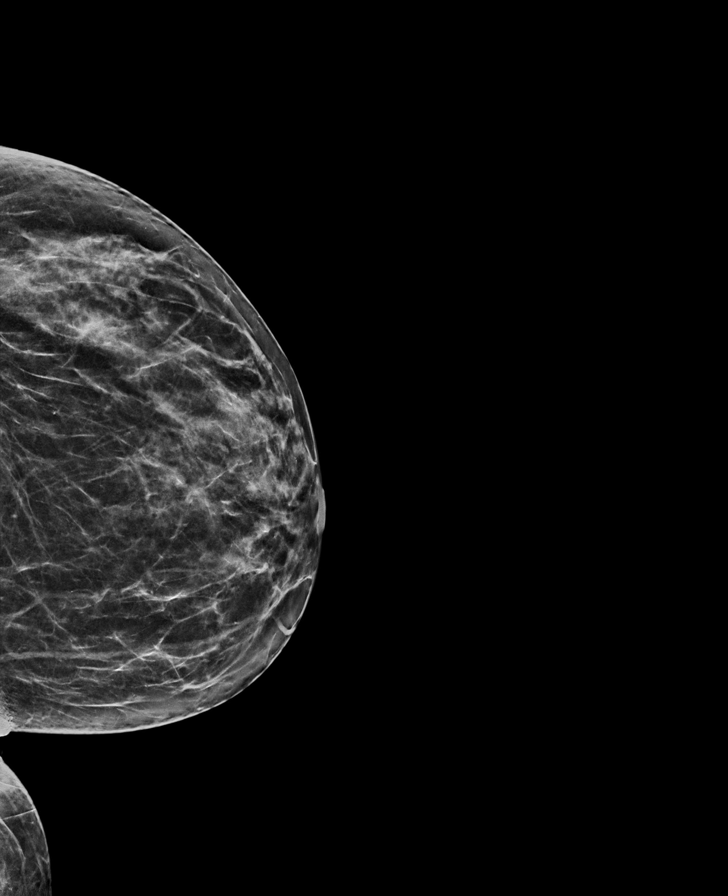

[L MLO synth-2D]
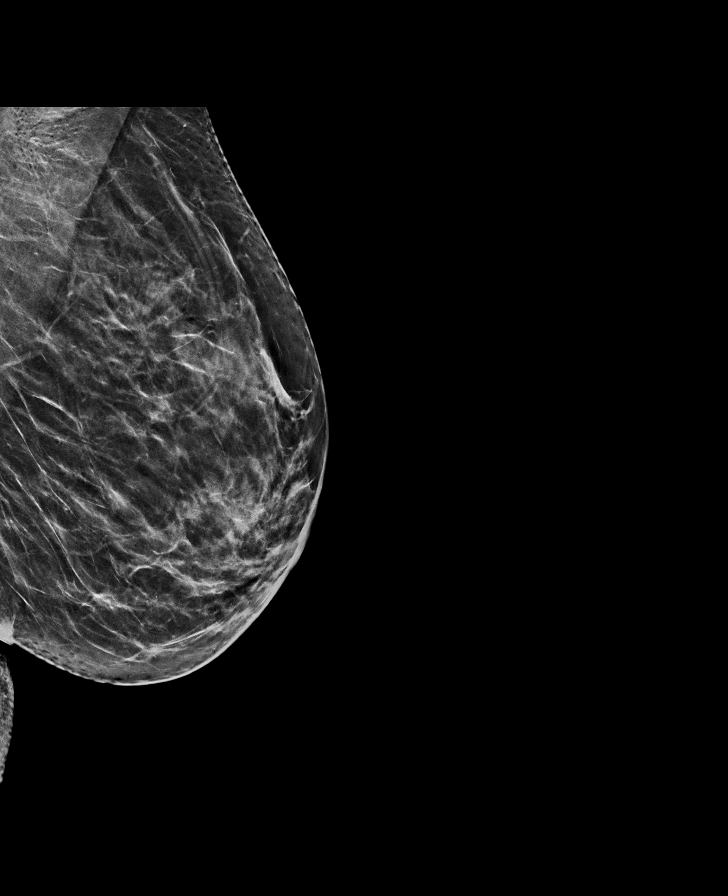

[R CC synth-2D]
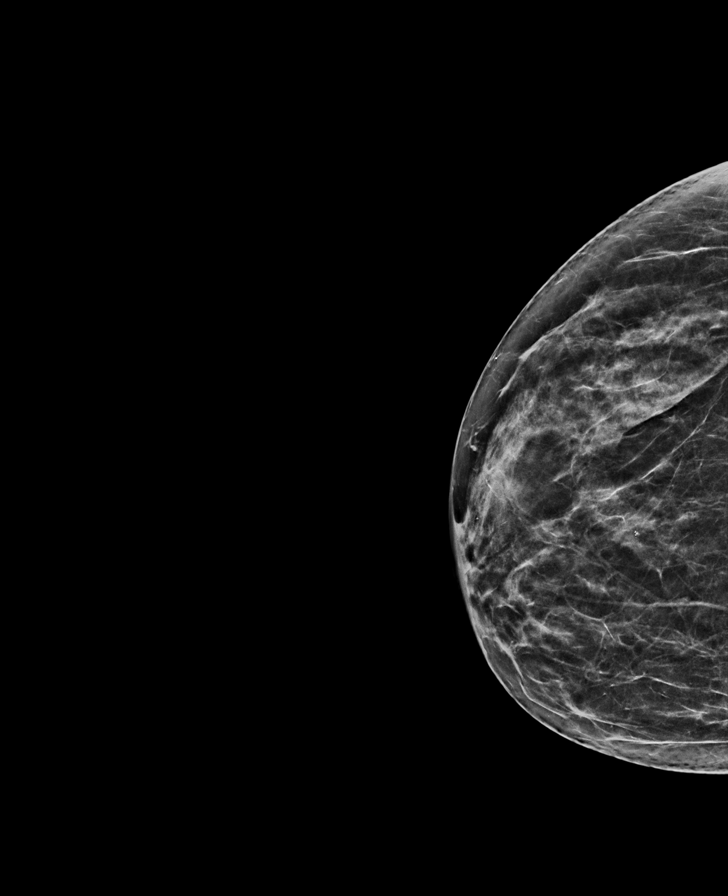

[L MLO tomo · tomo slice 33/65.0]
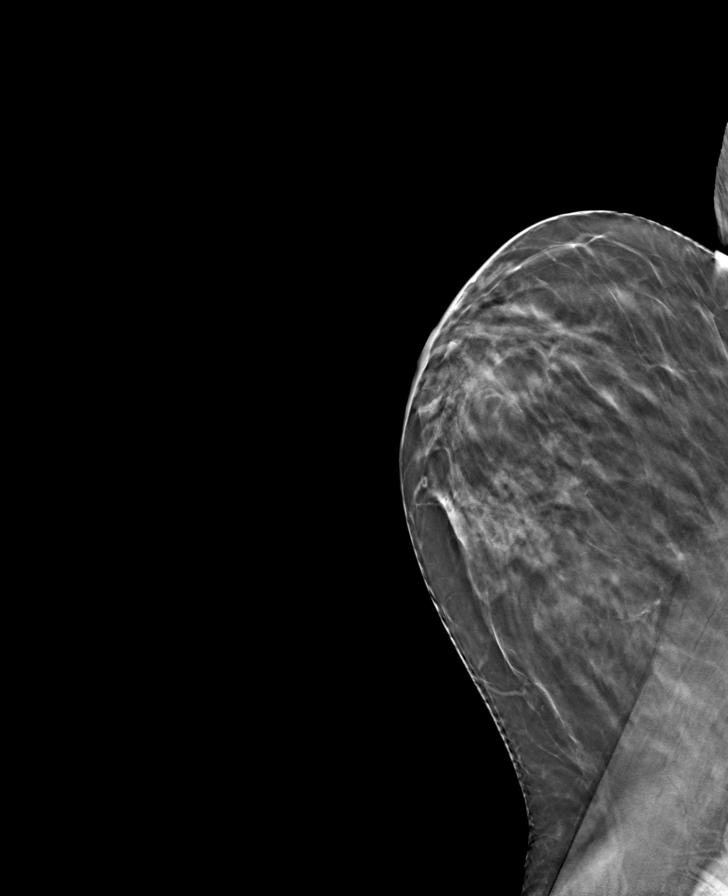

[R CC tomo · tomo slice 29/58.0]
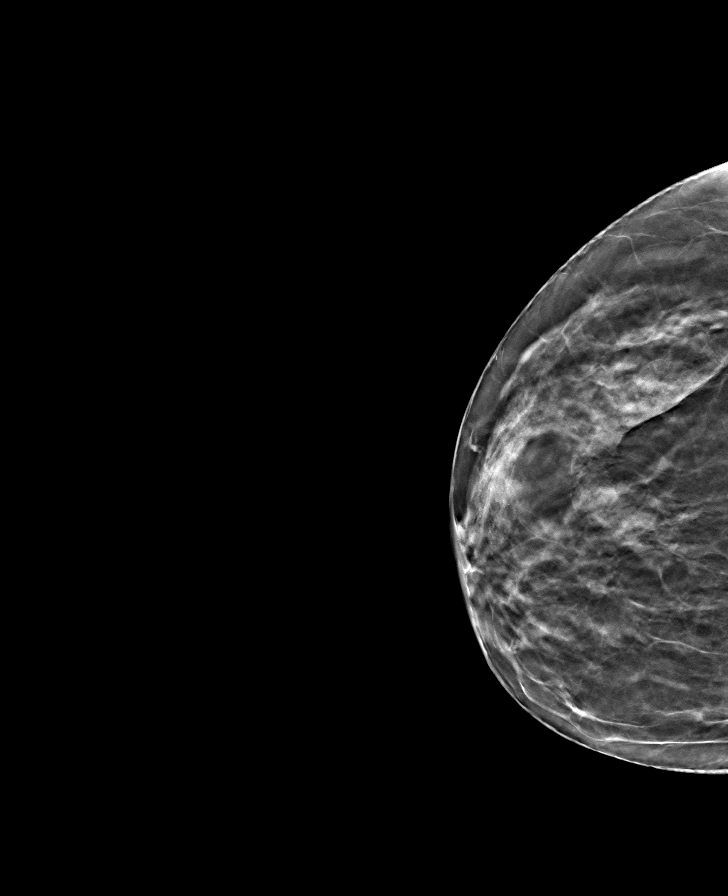

[L CC tomo · tomo slice 31/61.0]
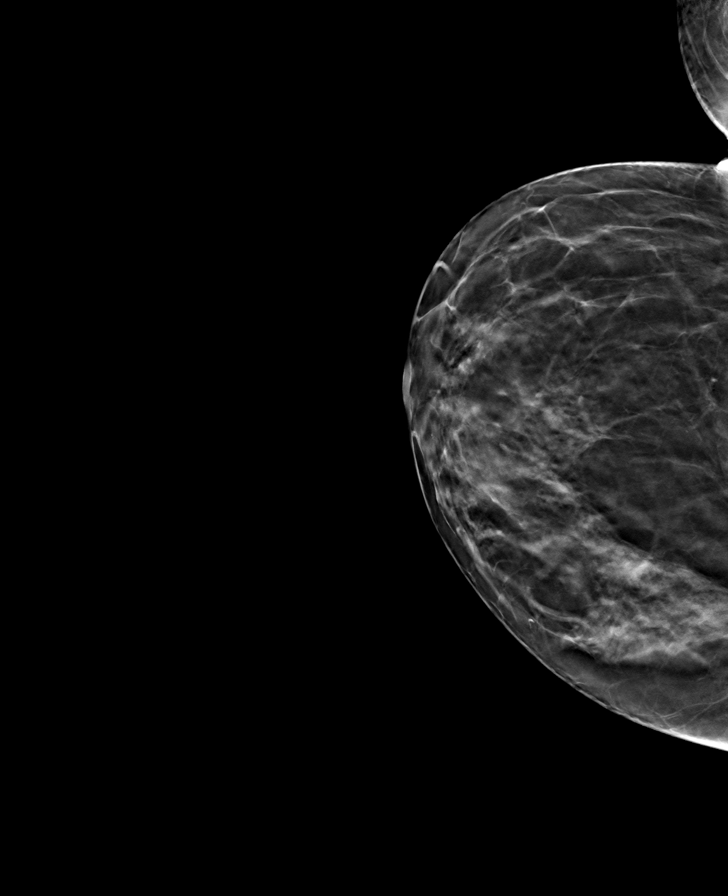

[R MLO tomo · tomo slice 33/66.0]
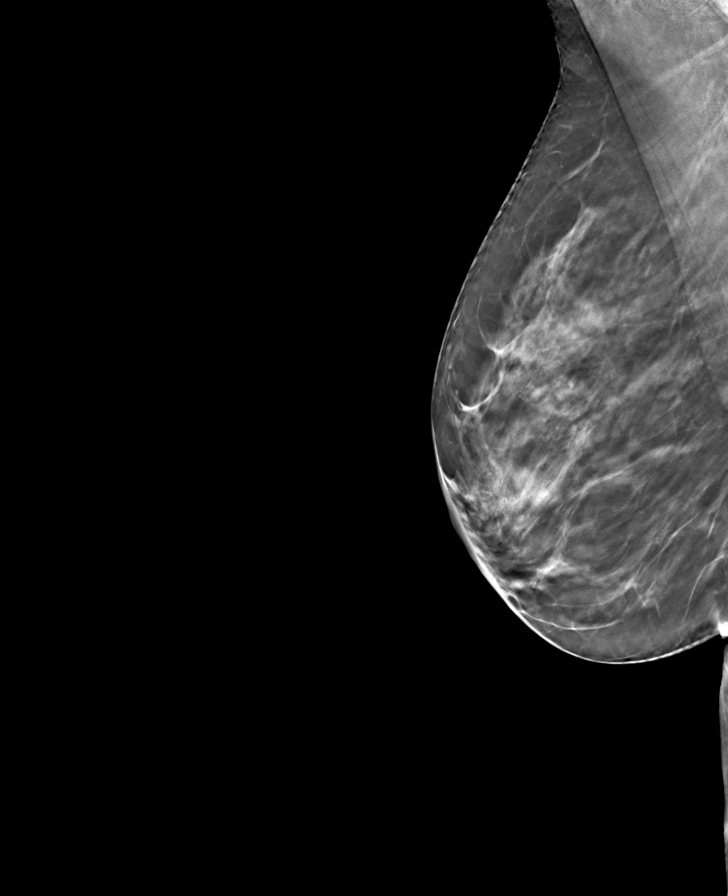

[8 of 24 positions shown; findings below may reference images not displayed]

ACR Breast Density Category b: There are scattered areas of
fibroglandular density.
FINDINGS: There are no findings suspicious for malignancy. The images were
evaluated with computer-aided detection.
IMPRESSION: No mammographic evidence of malignancy. A result letter of this
screening mammogram will be mailed directly to the patient.

RECOMMENDATION:
Screening mammogram in one year. (Code:WJ-I-BG6)

BI-RADS CATEGORY  1: Negative.

## 2021-07-21 DIAGNOSIS — S63522A Sprain of radiocarpal joint of left wrist, initial encounter: Secondary | ICD-10-CM | POA: Diagnosis not present

## 2021-10-26 ENCOUNTER — Encounter: Payer: Self-pay | Admitting: Internal Medicine

## 2021-10-26 ENCOUNTER — Ambulatory Visit (INDEPENDENT_AMBULATORY_CARE_PROVIDER_SITE_OTHER): Payer: BC Managed Care – PPO | Admitting: Internal Medicine

## 2021-10-26 VITALS — BP 130/72 | HR 86 | Temp 97.7°F | Ht 63.5 in | Wt 131.0 lb

## 2021-10-26 DIAGNOSIS — R7301 Impaired fasting glucose: Secondary | ICD-10-CM

## 2021-10-26 DIAGNOSIS — R5383 Other fatigue: Secondary | ICD-10-CM | POA: Diagnosis not present

## 2021-10-26 DIAGNOSIS — Z0000 Encounter for general adult medical examination without abnormal findings: Secondary | ICD-10-CM | POA: Diagnosis not present

## 2021-10-26 DIAGNOSIS — E785 Hyperlipidemia, unspecified: Secondary | ICD-10-CM | POA: Diagnosis not present

## 2021-10-26 DIAGNOSIS — Z23 Encounter for immunization: Secondary | ICD-10-CM | POA: Diagnosis not present

## 2021-10-26 NOTE — Assessment & Plan Note (Addendum)

## 2021-10-26 NOTE — Progress Notes (Signed)
The patient is here for annual preventive examination .   The risk factors are reflected in the social history.   The roster of all physicians providing medical care to patient - is listed in the Snapshot section of the chart.   Activities of daily living:  The patient is 100% independent in all ADLs: dressing, toileting, feeding as well as independent mobility   Home safety : The patient has smoke detectors in the home. They wear seatbelts.  There are no unsecured firearms at home. There is no violence in the home.    There is no risks for hepatitis, STDs or HIV. There is no   history of blood transfusion. They have no travel history to infectious disease endemic areas of the world.   The patient has seen their dentist in the last six month. They have seen their eye doctor in the last year. The patinet  denies slight hearing difficulty with regard to whispered voices and some television programs.  They have deferred audiologic testing in the last year.  They do not  have excessive sun exposure. Discussed the need for sun protection: hats, long sleeves and use of sunscreen if there is significant sun exposure.    Diet: the importance of a healthy diet is discussed. They do have a healthy diet.   The benefits of regular aerobic exercise were discussed. The patient  exercises  5 days per week  for  60 minutes.    Depression screen: there are no signs or vegative symptoms of depression- irritability, change in appetite, anhedonia, sadness/tearfullness.   The following portions of the patient's history were reviewed and updated as appropriate: allergies, current medications, past family history, past medical history,  past surgical history, past social history  and problem list.   Visual acuity was not assessed per patient preference since the patient has regular follow up with an  ophthalmologist. Hearing and body mass index were assessed and reviewed.    During the course of the visit the  patient was educated and counseled about appropriate screening and preventive services including : fall prevention , diabetes screening, nutrition counseling, colorectal cancer screening, and recommended immunizations.    Chief Complaint:  none   Review of Symptoms  Patient denies headache, fevers, malaise, unintentional weight loss, skin rash, eye pain, sinus congestion and sinus pain, sore throat, dysphagia,  hemoptysis , cough, dyspnea, wheezing, chest pain, palpitations, orthopnea, edema, abdominal pain, nausea, melena, diarrhea, constipation, flank pain, dysuria, hematuria, urinary  Frequency, nocturia, numbness, tingling, seizures,  Focal weakness, Loss of consciousness,  Tremor, insomnia, depression, anxiety, and suicidal ideation.    Physical Exam:  BP 130/72 (BP Location: Left Arm, Patient Position: Sitting, Cuff Size: Normal)   Pulse 86   Temp 97.7 F (36.5 C) (Oral)   Ht 5' 3.5" (1.613 m)   Wt 131 lb (59.4 kg)   LMP 10/25/2015 (Approximate)   SpO2 98%   BMI 22.84 kg/m    General appearance: alert, cooperative and appears stated age Ears: normal TM's and external ear canals both ears Throat: lips, mucosa, and tongue normal; teeth and gums normal Neck: no adenopathy, no carotid bruit, supple, symmetrical, trachea midline and thyroid not enlarged, symmetric, no tenderness/mass/nodules Back: symmetric, no curvature. ROM normal. No CVA tenderness. Lungs: clear to auscultation bilaterally Heart: regular rate and rhythm, S1, S2 normal, no murmur, click, rub or gallop Abdomen: soft, non-tender; bowel sounds normal; no masses,  no organomegaly Pulses: 2+ and symmetric Skin: Skin color, texture, turgor normal.  No rashes or lesions Lymph nodes: Cervical, supraclavicular, and axillary nodes normal.   Assessment and Plan:  Encounter for preventive health examination age appropriate education and counseling updated, referrals for preventative services and immunizations addressed,  dietary and smoking counseling addressed, most recent labs reviewed.  I have personally reviewed and have noted:   1) the patient's medical and social history 2) The pt's use of alcohol, tobacco, and illicit drugs 3) The patient's current medications and supplements 4) Functional ability including ADL's, fall risk, home safety risk, hearing and visual impairment 5) Diet and physical activities 6) Evidence for depression or mood disorder 7) The patient's height, weight, and BMI have been recorded in the chart   I have made referrals, and provided counseling and education based on review of the above   Updated Medication List No outpatient encounter medications on file as of 10/26/2021.   No facility-administered encounter medications on file as of 10/26/2021.

## 2021-10-26 NOTE — Patient Instructions (Signed)
If your colonoscopy was normal in 2015,  you are not due for screening until 2025

## 2021-10-27 ENCOUNTER — Encounter: Payer: Self-pay | Admitting: Internal Medicine

## 2021-10-27 LAB — CBC WITH DIFFERENTIAL/PLATELET
Basophils Absolute: 0 10*3/uL (ref 0.0–0.1)
Basophils Relative: 0.7 % (ref 0.0–3.0)
Eosinophils Absolute: 0.1 10*3/uL (ref 0.0–0.7)
Eosinophils Relative: 1.5 % (ref 0.0–5.0)
HCT: 40.8 % (ref 36.0–46.0)
Hemoglobin: 13.9 g/dL (ref 12.0–15.0)
Lymphocytes Relative: 23.5 % (ref 12.0–46.0)
Lymphs Abs: 1.7 10*3/uL (ref 0.7–4.0)
MCHC: 34.1 g/dL (ref 30.0–36.0)
MCV: 94.9 fl (ref 78.0–100.0)
Monocytes Absolute: 0.3 10*3/uL (ref 0.1–1.0)
Monocytes Relative: 4.6 % (ref 3.0–12.0)
Neutro Abs: 5 10*3/uL (ref 1.4–7.7)
Neutrophils Relative %: 69.7 % (ref 43.0–77.0)
Platelets: 281 10*3/uL (ref 150.0–400.0)
RBC: 4.3 Mil/uL (ref 3.87–5.11)
RDW: 11.8 % (ref 11.5–15.5)
WBC: 7.2 10*3/uL (ref 4.0–10.5)

## 2021-10-27 LAB — LIPID PANEL
Cholesterol: 216 mg/dL — ABNORMAL HIGH (ref 0–200)
HDL: 77.9 mg/dL (ref >39.00)
LDL Cholesterol: 122 mg/dL — ABNORMAL HIGH (ref 0–99)
NonHDL: 138.15
Total CHOL/HDL Ratio: 3
Triglycerides: 82 mg/dL (ref 0.0–149.0)
VLDL: 16.4 mg/dL (ref 0.0–40.0)

## 2021-10-27 LAB — COMPREHENSIVE METABOLIC PANEL
ALT: 16 U/L (ref 0–35)
AST: 20 U/L (ref 0–37)
Albumin: 4.6 g/dL (ref 3.5–5.2)
Alkaline Phosphatase: 68 U/L (ref 39–117)
BUN: 16 mg/dL (ref 6–23)
CO2: 29 mEq/L (ref 19–32)
Calcium: 9.7 mg/dL (ref 8.4–10.5)
Chloride: 102 mEq/L (ref 96–112)
Creatinine, Ser: 0.74 mg/dL (ref 0.40–1.20)
GFR: 88.74 mL/min (ref >60.00)
Glucose, Bld: 100 mg/dL — ABNORMAL HIGH (ref 70–99)
Potassium: 4 mEq/L (ref 3.5–5.1)
Sodium: 140 mEq/L (ref 135–145)
Total Bilirubin: 0.8 mg/dL (ref 0.2–1.2)
Total Protein: 6.6 g/dL (ref 6.0–8.3)

## 2021-10-27 LAB — TSH: TSH: 1.22 u[IU]/mL (ref 0.35–5.50)

## 2021-12-31 ENCOUNTER — Encounter: Payer: BC Managed Care – PPO | Admitting: Internal Medicine

## 2022-02-19 ENCOUNTER — Encounter: Payer: Self-pay | Admitting: Internal Medicine

## 2022-02-24 ENCOUNTER — Encounter: Payer: Self-pay | Admitting: Internal Medicine

## 2022-02-24 ENCOUNTER — Ambulatory Visit: Payer: 59 | Admitting: Internal Medicine

## 2022-02-24 VITALS — BP 120/70 | HR 85 | Temp 97.9°F | Resp 16 | Ht 64.0 in | Wt 126.6 lb

## 2022-02-24 DIAGNOSIS — B351 Tinea unguium: Secondary | ICD-10-CM | POA: Insufficient documentation

## 2022-02-24 MED ORDER — CICLOPIROX 8 % EX SOLN: Freq: Every day | CUTANEOUS | 0 refills | Status: DC

## 2022-02-24 NOTE — Assessment & Plan Note (Signed)
Right toe.  Ciclopirox prescribed for nightly use

## 2022-02-24 NOTE — Patient Instructions (Signed)
I am prescribing a topical antifungal solution to paint your nail with once daily in the evening (see below).  Take a picture to document the start. If you see no improvement whatsoever in a month ,  schedule a return visit  This is a very slow to heal infection (takes months! )

## 2022-02-24 NOTE — Progress Notes (Signed)
   Subjective:  Patient ID: Lori Farley, female    DOB: December 29, 1960  Age: 62 y.o. MRN: [id number]  CC: There were no encounter diagnoses.   HPI Lori Farley presents for  Chief Complaint  Patient presents with   Nail Problem    Toe nail fungus      No outpatient medications prior to visit.   No facility-administered medications prior to visit.    Review of Systems;  Patient denies headache, fevers, malaise, unintentional weight loss, skin rash, eye pain, sinus congestion and sinus pain, sore throat, dysphagia,  hemoptysis , cough, dyspnea, wheezing, chest pain, palpitations, orthopnea, edema, abdominal pain, nausea, melena, diarrhea, constipation, flank pain, dysuria, hematuria, urinary  Frequency, nocturia, numbness, tingling, seizures,  Focal weakness, Loss of consciousness,  Tremor, insomnia, depression, anxiety, and suicidal ideation.      Objective:  BP 120/70   Pulse 85   Temp 97.9 F (36.6 C)   Resp 16   Ht '5\' 4"'$  (1.626 m)   Wt 126 lb 9.6 oz (57.4 kg)   LMP 10/25/2015 (Approximate)   SpO2 98%   BMI 21.73 kg/m   BP Readings from Last 3 Encounters:  02/24/22 120/70  10/26/21 130/72  09/03/20 (!) 142/80    Wt Readings from Last 3 Encounters:  02/24/22 126 lb 9.6 oz (57.4 kg)  10/26/21 131 lb (59.4 kg)  09/03/20 131 lb 9.6 oz (59.7 kg)    Physical Exam  No results found for: "HGBA1C"  Lab Results  Component Value Date   CREATININE 0.74 10/26/2021   CREATININE 0.70 09/04/2020   CREATININE 0.71 08/10/2019    Lab Results  Component Value Date   WBC 7.2 10/26/2021   HGB 13.9 10/26/2021   HCT 40.8 10/26/2021   PLT 281.0 10/26/2021   GLUCOSE 100 (H) 10/26/2021   CHOL 216 (H) 10/26/2021   TRIG 82.0 10/26/2021   HDL 77.90 10/26/2021   LDLCALC 122 (H) 10/26/2021   ALT 16 10/26/2021   AST 20 10/26/2021   NA 140 10/26/2021   K 4.0 10/26/2021   CL 102 10/26/2021   CREATININE 0.74 10/26/2021   BUN 16 10/26/2021   CO2 29 10/26/2021   TSH 1.22  10/26/2021   MICROALBUR 0.7 08/10/2019    MM 3D SCREEN BREAST BILATERAL  Result Date: 06/18/2021 CLINICAL DATA:  Screening. EXAM: DIGITAL SCREENING BILATERAL MAMMOGRAM WITH TOMOSYNTHESIS AND CAD TECHNIQUE: Bilateral screening digital craniocaudal and mediolateral oblique mammograms were obtained. Bilateral screening digital breast tomosynthesis was performed. The images were evaluated with computer-aided detection. COMPARISON:  Previous exam(s). ACR Breast Density Category c: The breast tissue is heterogeneously dense, which may obscure small masses. FINDINGS: There are no findings suspicious for malignancy. IMPRESSION: No mammographic evidence of malignancy. A result letter of this screening mammogram will be mailed directly to the patient. RECOMMENDATION: Screening mammogram in one year. (Code:SM-B-01Y) BI-RADS CATEGORY  1: Negative. Electronically Signed   By: Margarette Canada M.D.   On: 06/18/2021 08:37    Assessment & Plan:  .There are no diagnoses linked to this encounter.   I provided 30 minutes of face-to-face time during this encounter reviewing patient's last visit with me, patient's  most recent visit with cardiology,  nephrology,  and neurology,  recent surgical and non surgical procedures, previous  labs and imaging studies, counseling on currently addressed issues,  and post visit ordering to diagnostics and therapeutics .   Follow-up: No follow-ups on file.   Crecencio Mc, MD

## 2022-06-02 ENCOUNTER — Other Ambulatory Visit: Payer: Self-pay | Admitting: Obstetrics and Gynecology

## 2022-06-02 DIAGNOSIS — Z01419 Encounter for gynecological examination (general) (routine) without abnormal findings: Secondary | ICD-10-CM | POA: Diagnosis not present

## 2022-06-02 DIAGNOSIS — Z1231 Encounter for screening mammogram for malignant neoplasm of breast: Secondary | ICD-10-CM | POA: Diagnosis not present

## 2022-06-02 DIAGNOSIS — Z1331 Encounter for screening for depression: Secondary | ICD-10-CM | POA: Diagnosis not present

## 2022-06-24 ENCOUNTER — Ambulatory Visit
Admission: RE | Admit: 2022-06-24 | Discharge: 2022-06-24 | Disposition: A | Discharge status: Home / Self Care | Payer: 59 | Source: Ambulatory Visit | Attending: Obstetrics and Gynecology | Admitting: Obstetrics and Gynecology

## 2022-06-24 DIAGNOSIS — Z1231 Encounter for screening mammogram for malignant neoplasm of breast: Secondary | ICD-10-CM | POA: Insufficient documentation

## 2022-09-02 ENCOUNTER — Ambulatory Visit: Payer: Self-pay | Admitting: Podiatry

## 2022-09-06 ENCOUNTER — Encounter: Payer: Self-pay | Admitting: Podiatry

## 2022-09-06 ENCOUNTER — Ambulatory Visit: Payer: 59 | Admitting: Podiatry

## 2022-09-06 DIAGNOSIS — L603 Nail dystrophy: Secondary | ICD-10-CM | POA: Diagnosis not present

## 2022-09-06 DIAGNOSIS — N949 Unspecified condition associated with female genital organs and menstrual cycle: Secondary | ICD-10-CM | POA: Insufficient documentation

## 2022-09-06 DIAGNOSIS — L608 Other nail disorders: Secondary | ICD-10-CM | POA: Diagnosis not present

## 2022-09-06 NOTE — Progress Notes (Signed)
  Subjective:  Patient ID: Lori Farley, female    DOB: 1960/04/21,  MRN: [id number] HPI Chief Complaint  Patient presents with   Nail Problem    Hallux right - thick, discolored nail at the tip x years, PCP eval-she didn't think it was a fungus, thought it was more of an injury-does play tennis and pickle ball, PCP Rx'd ciclopirox-been using couple months now, wears nail polish a lot, now the nail is split   New Patient (Initial Visit)    62 y.o. female presents with the above complaint.   ROS: Denies fever chills nausea vomit muscle aches pains calf pain back pain chest pain shortness of breath  Tennis player  Past Medical History:  Diagnosis Date   Actinic keratosis    Chest   Hx of dysplastic nevus 05/11/2000   L superior buttock, slight   Past Surgical History:  Procedure Laterality Date   CESAREAN SECTION  1994   CESAREAN SECTION  1997    Current Outpatient Medications:    ciclopirox (PENLAC) 8 % solution, Apply topically at bedtime. Apply over nail and surrounding skin. Apply daily over previous coat. After seven (7) days, may remove with alcohol and continue cycle., Disp: 6.6 mL, Rfl: 0  Allergies  Allergen Reactions   Shellfish Allergy Shortness Of Breath, Palpitations and Rash   Review of Systems Objective:  There were no vitals filed for this visit.  General: Well developed, nourished, in no acute distress, alert and oriented x3   Dermatological: Skin is warm, dry and supple bilateral. Nails x 10 are well maintained; remaining integument appears unremarkable at this time. There are no open sores, no preulcerative lesions, no rash or signs of infection present.  She apparently has a nail dystrophy to the medial side of the hallux nail right with a possible injury to the matrix which has resulted in a permanent line from proximal to distal.  This portion of this line has split proximally allowing the distal aspect of the nail plate to lift there is discoloration  brown and green underneath the free edge of this nail portion.  Vascular: Dorsalis Pedis artery and Posterior Tibial artery pedal pulses are 2/4 bilateral with immedate capillary fill time. Pedal hair growth present. No varicosities and no lower extremity edema present bilateral.   Neruologic: Grossly intact via light touch bilateral. Vibratory intact via tuning fork bilateral. Protective threshold with Semmes Wienstein monofilament intact to all pedal sites bilateral. Patellar and Achilles deep tendon reflexes 2+ bilateral. No Babinski or clonus noted bilateral.   Musculoskeletal: No gross boney pedal deformities bilateral. No pain, crepitus, or limitation noted with foot and ankle range of motion bilateral. Muscular strength 5/5 in all groups tested bilateral.  Gait: Unassisted, Nonantalgic.    Radiographs:  None taken  Assessment & Plan:   Assessment: Cannot rule out onychomycosis but this is a nail dystrophy.  Plan: Discussed etiology pathology conservative surgical therapies at this point I did recommend topical therapy such as an antibiotic ointment or antifungal ointment I also recommended that she start with biotin tablets to help with her thin nails.  I am also took a sample of the nail to be sent for pathologic evaluation we will follow-up with her in 1 month     Jizel Cheeks T. [place], [place]

## 2022-09-20 ENCOUNTER — Telehealth: Payer: Self-pay | Admitting: *Deleted

## 2022-09-20 MED ORDER — NEOMYCIN-POLYMYXIN-HC 1 % OT SOLN: OTIC | 3 refills | Status: DC

## 2022-09-20 NOTE — Telephone Encounter (Signed)
Sent patient MyChart message with information.  

## 2022-09-20 NOTE — Telephone Encounter (Signed)
-----   Message from Elinor Parkinson sent at 09/20/2022  7:19 AM EDT ----- Negative for fungus but positive for bacteria.  Can try cipro drops.

## 2022-09-24 ENCOUNTER — Telehealth: Payer: Self-pay | Admitting: *Deleted

## 2022-09-24 NOTE — Telephone Encounter (Signed)
-----   Message from Elinor Parkinson sent at 09/20/2022  7:19 AM EDT ----- Negative for fungus but positive for bacteria.  Can try cipro drops.

## 2022-10-04 ENCOUNTER — Ambulatory Visit (INDEPENDENT_AMBULATORY_CARE_PROVIDER_SITE_OTHER): Payer: 59 | Admitting: Podiatry

## 2022-10-04 ENCOUNTER — Encounter: Payer: Self-pay | Admitting: Podiatry

## 2022-10-04 DIAGNOSIS — L603 Nail dystrophy: Secondary | ICD-10-CM

## 2022-10-04 MED ORDER — NEOMYCIN-POLYMYXIN-HC 1 % OT SOLN: OTIC | 3 refills | Status: DC

## 2022-10-04 NOTE — Progress Notes (Signed)
Patient presents to office for results of BAKO fungal culture. During rooming, I asked if she had received the message I sent to MyChart. She states she saw the results but didn't understand.   I explained there was not a fungus identified in the toenail, however, there was a bacterial colonization. I had already sent in cortisporin otic drops for her on 09/20/22. She will need to follow up in 3 months if nail is no better.   She left without being seen by the doctor and Joni Reining did refund her copay.

## 2022-10-04 NOTE — Addendum Note (Signed)
Addended by: Lottie Rater E on: 10/04/2022 05:06 PM   Modules accepted: Orders

## 2023-01-28 ENCOUNTER — Encounter: Payer: 59 | Admitting: Internal Medicine

## 2023-01-28 DIAGNOSIS — E785 Hyperlipidemia, unspecified: Secondary | ICD-10-CM

## 2023-01-28 DIAGNOSIS — R5383 Other fatigue: Secondary | ICD-10-CM

## 2023-01-28 DIAGNOSIS — Z1231 Encounter for screening mammogram for malignant neoplasm of breast: Secondary | ICD-10-CM

## 2023-01-28 DIAGNOSIS — Z0000 Encounter for general adult medical examination without abnormal findings: Secondary | ICD-10-CM

## 2023-01-28 DIAGNOSIS — R7301 Impaired fasting glucose: Secondary | ICD-10-CM

## 2023-03-04 ENCOUNTER — Ambulatory Visit (INDEPENDENT_AMBULATORY_CARE_PROVIDER_SITE_OTHER): Payer: 59 | Admitting: Internal Medicine

## 2023-03-04 VITALS — BP 124/72 | HR 92 | Ht 63.5 in | Wt 129.6 lb

## 2023-03-04 DIAGNOSIS — B351 Tinea unguium: Secondary | ICD-10-CM | POA: Diagnosis not present

## 2023-03-04 DIAGNOSIS — R7301 Impaired fasting glucose: Secondary | ICD-10-CM

## 2023-03-04 DIAGNOSIS — E785 Hyperlipidemia, unspecified: Secondary | ICD-10-CM

## 2023-03-04 DIAGNOSIS — Z1211 Encounter for screening for malignant neoplasm of colon: Secondary | ICD-10-CM | POA: Diagnosis not present

## 2023-03-04 DIAGNOSIS — R5383 Other fatigue: Secondary | ICD-10-CM

## 2023-03-04 DIAGNOSIS — Z1231 Encounter for screening mammogram for malignant neoplasm of breast: Secondary | ICD-10-CM

## 2023-03-04 DIAGNOSIS — Z0000 Encounter for general adult medical examination without abnormal findings: Secondary | ICD-10-CM | POA: Diagnosis not present

## 2023-03-04 NOTE — Patient Instructions (Addendum)
  You might want to try using Relaxium for insomnia  (as seen on TV commercials) . It is available through [employer] and contains all natural supplements:  Melatonin 5 mg  Chamomile 25 mg Passionflower extract 75 mg GABA 100 mg Ashwaganda extract 125 mg Magnesium citrate, glycinate, oxide (100 mg)  L tryptophan 500 mg Valerest (proprietary  ingredient ; probably valeria root extract)   app  Also try headspace   app  Hair Skin and Nails  supplements are helpful in improving nail thickness,  but the ridges are due to dryness and aging .SABRASABRASABRASABRA

## 2023-03-04 NOTE — Progress Notes (Signed)
 Patient ID: Lori Farley, female    DOB: 02/12/1960  Age: 63 y.o. MRN: [id number]  The patient is here for annual preventive examination and management of other chronic and acute problems.   The risk factors are reflected in the social history.   The roster of all physicians providing medical care to patient - is listed in the Snapshot section of the chart.   Activities of daily living:  The patient is 100% independent in all ADLs: dressing, toileting, feeding as well as independent mobility   Home safety : The patient has smoke detectors in the home. They wear seatbelts.  There are no unsecured firearms at home. There is no violence in the home.    There is no risks for hepatitis, STDs or HIV. There is no   history of blood transfusion. They have no travel history to infectious disease endemic areas of the world.   The patient has seen their dentist in the last six month. They have seen their eye doctor in the last year. The patinet  denies slight hearing difficulty with regard to whispered voices and some television programs.  They have deferred audiologic testing in the last year.  They do not  have excessive sun exposure. Discussed the need for sun protection: hats, long sleeves and use of sunscreen if there is significant sun exposure.    Diet: the importance of a healthy diet is discussed. They do have a healthy diet.   The benefits of regular aerobic exercise were discussed. The patient  exercises   5 days per week  for  60 minutes.    Depression screen: there are no signs or vegative symptoms of depression- irritability, change in appetite, anhedonia, sadness/tearfullness.   The following portions of the patient's history were reviewed and updated as appropriate: allergies, current medications, past family history, past medical history,  past surgical history, past social history  and problem list.   Visual acuity was not assessed per patient preference since the patient has regular  follow up with an  ophthalmologist. Hearing and body mass index were assessed and reviewed.    During the course of the visit the patient was educated and counseled about appropriate screening and preventive services including : fall prevention , diabetes screening, nutrition counseling, colorectal cancer screening, and recommended immunizations.    Chief Complaint:   nail dystrophy  Sun damaged skin    Review of Symptoms  Patient denies headache, fevers, malaise, unintentional weight loss, skin rash, eye pain, sinus congestion and sinus pain, sore throat, dysphagia,  hemoptysis , cough, dyspnea, wheezing, chest pain, palpitations, orthopnea, edema, abdominal pain, nausea, melena, diarrhea, constipation, flank pain, dysuria, hematuria, urinary  Frequency, nocturia, numbness, tingling, seizures,  Focal weakness, Loss of consciousness,  Tremor, insomnia, depression, anxiety, and suicidal ideation.    Physical Exam:  BP 124/72 (BP Location: Left Arm, Patient Position: Sitting, Cuff Size: Normal)   Pulse 92   Ht 5' 3.5 (1.613 m)   Wt 129 lb 9.6 oz (58.8 kg)   LMP 10/25/2015 (Approximate)   SpO2 99%   BMI 22.60 kg/m    Physical Exam Vitals reviewed.  Constitutional:      General: She is not in acute distress.    Appearance: Normal appearance. She is normal weight. She is not ill-appearing, toxic-appearing or diaphoretic.  HENT:     Head: Normocephalic.  Eyes:     General: No scleral icterus.       Right eye: No discharge.  Left eye: No discharge.     Conjunctiva/sclera: Conjunctivae normal.  Cardiovascular:     Rate and Rhythm: Normal rate and regular rhythm.     Heart sounds: Normal heart sounds.  Pulmonary:     Effort: Pulmonary effort is normal. No respiratory distress.     Breath sounds: Normal breath sounds.  Musculoskeletal:        General: Normal range of motion.  Skin:    General: Skin is warm and dry.  Neurological:     General: No focal deficit present.      Mental Status: She is alert and oriented to person, place, and time. Mental status is at baseline.  Psychiatric:        Mood and Affect: Mood normal.        Behavior: Behavior normal.        Thought Content: Thought content normal.        Judgment: Judgment normal.    Assessment and Plan: Encounter for screening mammogram for malignant neoplasm of breast -     3D Screening Mammogram, Left and Right; Future  Colon cancer screening -     Ambulatory referral to Gastroenterology  Encounter for preventive health examination Assessment & Plan: age appropriate education and counseling updated, referrals for preventative services and immunizations addressed, dietary and smoking counseling addressed, most recent labs reviewed.  I have personally reviewed and have noted:   1) the patient's medical and social history 2) The pt's use of alcohol, tobacco, and illicit drugs 3) The patient's current medications and supplements 4) Functional ability including ADL's, fall risk, home safety risk, hearing and visual impairment 5) Diet and physical activities 6) Evidence for depression or mood disorder 7) The patient's height, weight, and BMI have been recorded in the chart   I have made referrals, and provided counseling and education based on review of the above    Fatigue, unspecified type -     CBC with Differential/Platelet -     TSH  Hyperlipidemia, unspecified hyperlipidemia type -     Lipid panel -     LDL cholesterol, direct  Impaired fasting glucose -     Comprehensive metabolic panel -     Hemoglobin A1c  Onychomycosis Assessment & Plan: Right great toe. She stopped using  Ciclopirox  after lack of effect and saw podiatry ; a biopsy as done of a trimming and reviewed with patient today .  The etiology appears to be microtrauma from playing tennis      Return in about 1 year (around 03/03/2024).  Verneita LITTIE Kettering, MD

## 2023-03-06 NOTE — Assessment & Plan Note (Signed)
 Right great toe. She stopped using  Ciclopirox  after lack of effect and saw podiatry ; a biopsy as done of a trimming and reviewed with patient today .  The etiology appears to be microtrauma from playing tennis

## 2023-03-06 NOTE — Assessment & Plan Note (Signed)

## 2023-03-07 ENCOUNTER — Other Ambulatory Visit: Payer: 59

## 2023-03-07 ENCOUNTER — Other Ambulatory Visit (INDEPENDENT_AMBULATORY_CARE_PROVIDER_SITE_OTHER): Payer: 59

## 2023-03-07 DIAGNOSIS — R7301 Impaired fasting glucose: Secondary | ICD-10-CM

## 2023-03-07 DIAGNOSIS — E785 Hyperlipidemia, unspecified: Secondary | ICD-10-CM

## 2023-03-07 DIAGNOSIS — R5383 Other fatigue: Secondary | ICD-10-CM

## 2023-03-07 LAB — CBC WITH DIFFERENTIAL/PLATELET
Basophils Absolute: 0 10*3/uL (ref 0.0–0.1)
Basophils Relative: 0.6 % (ref 0.0–3.0)
Eosinophils Absolute: 0.2 10*3/uL (ref 0.0–0.7)
Eosinophils Relative: 3.6 % (ref 0.0–5.0)
HCT: 42.5 % (ref 36.0–46.0)
Hemoglobin: 14.2 g/dL (ref 12.0–15.0)
Lymphocytes Relative: 34.5 % (ref 12.0–46.0)
Lymphs Abs: 1.6 10*3/uL (ref 0.7–4.0)
MCHC: 33.5 g/dL (ref 30.0–36.0)
MCV: 95.3 fL (ref 78.0–100.0)
Monocytes Absolute: 0.3 10*3/uL (ref 0.1–1.0)
Monocytes Relative: 7.1 % (ref 3.0–12.0)
Neutro Abs: 2.6 10*3/uL (ref 1.4–7.7)
Neutrophils Relative %: 54.2 % (ref 43.0–77.0)
Platelets: 324 10*3/uL (ref 150.0–400.0)
RBC: 4.45 Mil/uL (ref 3.87–5.11)
RDW: 12.1 % (ref 11.5–15.5)
WBC: 4.7 10*3/uL (ref 4.0–10.5)

## 2023-03-07 LAB — LIPID PANEL
Cholesterol: 208 mg/dL — ABNORMAL HIGH (ref 0–200)
HDL: 93.1 mg/dL (ref >39.00)
LDL Cholesterol: 106 mg/dL — ABNORMAL HIGH (ref 0–99)
NonHDL: 115.39
Total CHOL/HDL Ratio: 2
Triglycerides: 47 mg/dL (ref 0.0–149.0)
VLDL: 9.4 mg/dL (ref 0.0–40.0)

## 2023-03-07 LAB — COMPREHENSIVE METABOLIC PANEL
ALT: 20 U/L (ref 0–35)
AST: 24 U/L (ref 0–37)
Albumin: 4.5 g/dL (ref 3.5–5.2)
Alkaline Phosphatase: 82 U/L (ref 39–117)
BUN: 13 mg/dL (ref 6–23)
CO2: 26 meq/L (ref 19–32)
Calcium: 9.3 mg/dL (ref 8.4–10.5)
Chloride: 102 meq/L (ref 96–112)
Creatinine, Ser: 0.64 mg/dL (ref 0.40–1.20)
GFR: 96.02 mL/min (ref >60.00)
Glucose, Bld: 108 mg/dL — ABNORMAL HIGH (ref 70–99)
Potassium: 4.6 meq/L (ref 3.5–5.1)
Sodium: 138 meq/L (ref 135–145)
Total Bilirubin: 0.6 mg/dL (ref 0.2–1.2)
Total Protein: 6.8 g/dL (ref 6.0–8.3)

## 2023-03-07 LAB — TSH: TSH: 1.6 u[IU]/mL (ref 0.35–5.50)

## 2023-03-07 LAB — LDL CHOLESTEROL, DIRECT: Direct LDL: 119 mg/dL

## 2023-03-07 LAB — HEMOGLOBIN A1C: Hgb A1c MFr Bld: 5.3 % (ref 4.6–6.5)

## 2023-03-07 NOTE — Addendum Note (Signed)
 Addended by: Thressa Flora D on: 03/07/2023 09:36 AM   Modules accepted: Orders

## 2023-03-09 ENCOUNTER — Encounter: Payer: Self-pay | Admitting: Internal Medicine

## 2023-08-12 DIAGNOSIS — Z01419 Encounter for gynecological examination (general) (routine) without abnormal findings: Secondary | ICD-10-CM | POA: Diagnosis not present

## 2023-08-12 DIAGNOSIS — Z1231 Encounter for screening mammogram for malignant neoplasm of breast: Secondary | ICD-10-CM | POA: Diagnosis not present

## 2023-08-12 DIAGNOSIS — Z1331 Encounter for screening for depression: Secondary | ICD-10-CM | POA: Diagnosis not present

## 2023-09-22 ENCOUNTER — Encounter

## 2023-10-06 ENCOUNTER — Ambulatory Visit
Admission: RE | Admit: 2023-10-06 | Discharge: 2023-10-06 | Disposition: A | Discharge status: Home / Self Care | Source: Ambulatory Visit | Attending: Internal Medicine | Admitting: Internal Medicine

## 2023-10-06 DIAGNOSIS — Z1231 Encounter for screening mammogram for malignant neoplasm of breast: Secondary | ICD-10-CM | POA: Insufficient documentation

## 2023-10-20 ENCOUNTER — Other Ambulatory Visit: Payer: Self-pay

## 2023-10-20 ENCOUNTER — Encounter: Payer: Self-pay | Admitting: Gastroenterology

## 2023-10-28 ENCOUNTER — Ambulatory Visit: Admitting: Anesthesiology

## 2023-10-28 ENCOUNTER — Encounter: Admission: RE | Disposition: A | Payer: Self-pay | Source: Home / Self Care | Attending: Gastroenterology

## 2023-10-28 ENCOUNTER — Ambulatory Visit
Admission: RE | Admit: 2023-10-28 | Discharge: 2023-10-28 | Disposition: A | Discharge status: Home / Self Care | Attending: Gastroenterology | Admitting: Gastroenterology

## 2023-10-28 ENCOUNTER — Encounter: Payer: Self-pay | Admitting: Gastroenterology

## 2023-10-28 DIAGNOSIS — D120 Benign neoplasm of cecum: Secondary | ICD-10-CM | POA: Diagnosis not present

## 2023-10-28 DIAGNOSIS — D124 Benign neoplasm of descending colon: Secondary | ICD-10-CM | POA: Insufficient documentation

## 2023-10-28 DIAGNOSIS — Z1211 Encounter for screening for malignant neoplasm of colon: Secondary | ICD-10-CM | POA: Insufficient documentation

## 2023-10-28 DIAGNOSIS — Z87891 Personal history of nicotine dependence: Secondary | ICD-10-CM | POA: Diagnosis not present

## 2023-10-28 DIAGNOSIS — K635 Polyp of colon: Secondary | ICD-10-CM | POA: Diagnosis not present

## 2023-10-28 HISTORY — PX: POLYPECTOMY: SHX149

## 2023-10-28 HISTORY — PX: COLONOSCOPY: SHX5424

## 2023-10-28 SURGERY — COLONOSCOPY
Anesthesia: General

## 2023-10-28 MED ORDER — LIDOCAINE HCL (CARDIAC) PF 100 MG/5ML IV SOSY
PREFILLED_SYRINGE | INTRAVENOUS | Status: DC | PRN
  Administered 2023-10-28: 60 mg via INTRAVENOUS

## 2023-10-28 MED ORDER — LIDOCAINE HCL (PF) 2 % IJ SOLN
INTRAMUSCULAR | Status: AC
  Filled 2023-10-28: qty 5

## 2023-10-28 MED ORDER — SODIUM CHLORIDE 0.9 % IV SOLN
INTRAVENOUS | Status: DC
  Administered 2023-10-28: 20 mL/h via INTRAVENOUS

## 2023-10-28 MED ORDER — PROPOFOL 1000 MG/100ML IV EMUL
INTRAVENOUS | Status: AC
  Filled 2023-10-28: qty 100

## 2023-10-28 MED ORDER — PROPOFOL 500 MG/50ML IV EMUL
INTRAVENOUS | Status: DC | PRN
  Administered 2023-10-28: 140 ug/kg/min via INTRAVENOUS
  Administered 2023-10-28: 20 mg via INTRAVENOUS
  Administered 2023-10-28: 50 mg via INTRAVENOUS

## 2023-10-28 NOTE — Transfer of Care (Signed)
 Immediate Anesthesia Transfer of Care Note  Patient: Lori Farley  Procedure(s) Performed: COLONOSCOPY POLYPECTOMY, INTESTINE  Patient Location: PACU  Anesthesia Type:MAC  Level of Consciousness: responds to stimulation  Airway & Oxygen Therapy: Patient Spontanous Breathing  Post-op Assessment: Report given to RN and Post -op Vital signs reviewed and stable  Post vital signs: stable  Last Vitals:  Vitals Value Taken Time  BP 88/61 10/28/23 08:58  Temp 36 C 10/28/23 08:58  Pulse 62 10/28/23 08:59  Resp 16 10/28/23 08:59  SpO2 98 % 10/28/23 08:59  Vitals shown include unfiled device data.  Last Pain:  Vitals:   10/28/23 0858  TempSrc: Temporal  PainSc: Asleep         Complications: No notable events documented.

## 2023-10-28 NOTE — Anesthesia Postprocedure Evaluation (Signed)
 Anesthesia Post Note  Patient: Lori Farley  Procedure(s) Performed: COLONOSCOPY POLYPECTOMY, INTESTINE  Patient location during evaluation: PACU Anesthesia Type: General Level of consciousness: awake and awake and alert Pain management: satisfactory to patient Vital Signs Assessment: post-procedure vital signs reviewed and stable Respiratory status: spontaneous breathing Cardiovascular status: blood pressure returned to baseline Anesthetic complications: no   No notable events documented.   Last Vitals:  Vitals:   10/28/23 0858 10/28/23 0913  BP: (!) 88/61 95/75  Pulse: 62 74  Resp: 20   Temp: (!) 36 C   SpO2: 97% 96%    Last Pain:  Vitals:   10/28/23 0913  TempSrc:   PainSc: 0-No pain                 VAN STAVEREN,Johnny Latu

## 2023-10-28 NOTE — Anesthesia Preprocedure Evaluation (Signed)
 Anesthesia Evaluation  Patient identified by MRN, date of birth, ID band Patient awake    Reviewed: Allergy & Precautions, NPO status , Patient's Chart, lab work & pertinent test results  Airway Mallampati: II  TM Distance: >3 FB Neck ROM: Full    Dental  (+) Teeth Intact   Pulmonary neg pulmonary ROS, Patient abstained from smoking., former smoker   Pulmonary exam normal        Cardiovascular Exercise Tolerance: Good negative cardio ROS Normal cardiovascular exam Rhythm:Regular Rate:Normal     Neuro/Psych negative neurological ROS  negative psych ROS   GI/Hepatic negative GI ROS, Neg liver ROS,,,  Endo/Other  negative endocrine ROS    Renal/GU negative Renal ROS  negative genitourinary   Musculoskeletal   Abdominal Normal abdominal exam  (+)   Peds negative pediatric ROS (+)  Hematology negative hematology ROS (+)   Anesthesia Other Findings Past Medical History: No date: Actinic keratosis     Comment:  Chest 05/11/2000: Hx of dysplastic nevus     Comment:  L superior buttock, slight  Past Surgical History: 1994: CESAREAN SECTION 1997: CESAREAN SECTION  BMI    Body Mass Index: 22.63 kg/m      Reproductive/Obstetrics negative OB ROS                              Anesthesia Physical Anesthesia Plan  ASA: 1  Anesthesia Plan: General   Post-op Pain Management:    Induction:   PONV Risk Score and Plan: Propofol infusion and TIVA  Airway Management Planned: Natural Airway and Nasal Cannula  Additional Equipment:   Intra-op Plan:   Post-operative Plan:   Informed Consent: I have reviewed the patients History and Physical, chart, labs and discussed the procedure including the risks, benefits and alternatives for the proposed anesthesia with the patient or authorized representative who has indicated his/her understanding and acceptance.     Dental Advisory  Given  Plan Discussed with: CRNA  Anesthesia Plan Comments:         Anesthesia Quick Evaluation

## 2023-10-28 NOTE — H&P (Signed)
 [name], MD [facility], DHIP [address]  [place], [place] [address]  Main: [phone] Fax:  [phone] Pager: [phone]   Primary Care Physician:  Marylynn Verneita CROME, MD Primary Gastroenterologist:  Dr. [name]  Pre-Procedure History & Physical: HPI:  Lori Farley is a 63 y.o. female is here for an colonoscopy.   Past Medical History:  Diagnosis Date   Actinic keratosis    Chest   Hx of dysplastic nevus 05/11/2000   L superior buttock, slight    Past Surgical History:  Procedure Laterality Date   CESAREAN SECTION  1994   CESAREAN SECTION  1997    Prior to Admission medications   Not on File    Allergies as of 08/26/2023 - Review Complete 03/06/2023  Allergen Reaction Noted   Shellfish allergy Shortness Of Breath, Palpitations, and Rash 10/25/2016    Family History  Problem Relation Age of Onset   Breast cancer Maternal Aunt    Hypertension Mother    Brain cancer Father    Colon cancer Maternal Grandmother     Social History   Socioeconomic History   Marital status: Married    Spouse name: Not on file   Number of children: Not on file   Years of education: Not on file   Highest education level: Bachelor's degree (e.g., BA, AB, BS)  Occupational History   Not on file  Tobacco Use   Smoking status: Former   Smokeless tobacco: Never  Substance and Sexual Activity   Alcohol use: Yes   Drug use: No   Sexual activity: Not on file  Other Topics Concern   Not on file  Social History Narrative   Not on file   Social Drivers of Health   Financial Resource Strain: Low Risk  (08/12/2023)   Received from [facility]   Overall Financial Resource Strain (CARDIA)    Difficulty of Paying Living Expenses: Not hard at all  Food Insecurity: No Food Insecurity (08/12/2023)   Received from [facility]   Hunger Vital Sign    Within the past 12 months, you worried that your food  would run out before you got the money to buy more.: Never true    Within the past 12 months, the food you bought just didn't last and you didn't have money to get more.: Never true  Transportation Needs: No Transportation Needs (08/12/2023)   Received from [facility] - Transportation    In the past 12 months, has lack of transportation kept you from medical appointments or from getting medications?: No    Lack of Transportation (Non-Medical): No  Physical Activity: Sufficiently Active (03/04/2023)   Exercise Vital Sign    Days of Exercise per Week: 6 days    Minutes of Exercise per Session: 40 min  Stress: No Stress Concern Present (03/04/2023)   [employer] of Occupational Health - Occupational Stress Questionnaire    Feeling of Stress : Only a little  Social Connections: Socially Integrated (03/04/2023)   Social Connection and Isolation Panel    Frequency of Communication with Friends and Family: More than three times a week    Frequency of Social Gatherings with Friends and Family: More than three times a week    Attends Religious Services: More than 4 times per year    Active Member of [employer] or Organizations: Yes    Attends Banker Meetings: More than 4  times per year    Marital Status: Married  Catering manager Violence: Not on file    Review of Systems: See HPI, otherwise negative ROS  Physical Exam: BP 105/73   Pulse 78   Temp (!) 96.5 F (35.8 C) (Temporal)   Resp 20   Ht 5' 3.5 (1.613 m)   Wt 58.9 kg   LMP 10/25/2015 (Approximate)   SpO2 100%   BMI 22.63 kg/m  General:   Alert,  pleasant and cooperative in NAD Head:  Normocephalic and atraumatic. Neck:  Supple; no masses or thyromegaly. Lungs:  Clear throughout to auscultation.    Heart:  Regular rate and rhythm. Abdomen:  Soft, nontender and nondistended. Normal bowel sounds, without guarding, and without rebound.   Neurologic:  Alert and  oriented x4;  grossly normal  neurologically.  Impression/Plan: Lori Farley is here for an colonoscopy to be performed for colon cancer screening  Risks, benefits, limitations, and alternatives regarding  colonoscopy have been reviewed with the patient.  Questions have been answered.  All parties agreeable.   Corinn Brooklyn, MD  10/28/2023, 7:52 AM

## 2023-10-28 NOTE — Op Note (Signed)
 [facility] Patient Name: Lori Farley Procedure Date: 10/28/2023 8:24 AM MRN: [id number] Account #: [id number] Date of Birth: 1960/11/08 Admit Type: Outpatient Age: 63 Room: [facility] ENDO ROOM 1 Gender: Female Note Status: Finalized Instrument Name: Peds Colonoscope [id number] Procedure:             Colonoscopy Indications:           Screening for colorectal malignant neoplasm, Last                         colonoscopy 10 years ago Providers:             Corinn Jess Brooklyn MD, MD Referring MD:          Verneita Sara Medicines:             General Anesthesia Complications:         No immediate complications. Estimated blood loss: None. Procedure:             Pre-Anesthesia Assessment:                        - Prior to the procedure, a History and Physical was                         performed, and patient medications and allergies were                         reviewed. The patient is competent. The risks and                         benefits of the procedure and the sedation options and                         risks were discussed with the patient. All questions                         were answered and informed consent was obtained.                         Patient identification and proposed procedure were                         verified by the physician, the nurse, the                         anesthesiologist, the anesthetist and the technician                         in the pre-procedure area in the procedure room in the                         endoscopy suite. Mental Status Examination: alert and                         oriented. Airway Examination: normal oropharyngeal                         airway and neck mobility. Respiratory Examination:  clear to auscultation. CV Examination: normal.                         Prophylactic Antibiotics: The patient does not require                         prophylactic antibiotics. Prior  Anticoagulants: The                         patient has taken no anticoagulant or antiplatelet                         agents. ASA Grade Assessment: I - A normal, healthy                         patient. After reviewing the risks and benefits, the                         patient was deemed in satisfactory condition to                         undergo the procedure. The anesthesia plan was to use                         general anesthesia. Immediately prior to                         administration of medications, the patient was                         re-assessed for adequacy to receive sedatives. The                         heart rate, respiratory rate, oxygen saturations,                         blood pressure, adequacy of pulmonary ventilation, and                         response to care were monitored throughout the                         procedure. The physical status of the patient was                         re-assessed after the procedure.                        After obtaining informed consent, the colonoscope was                         passed under direct vision. Throughout the procedure,                         the patient's blood pressure, pulse, and oxygen                         saturations were monitored continuously. The  Colonoscope was introduced through the anus and                         advanced to the the cecum, identified by appendiceal                         orifice and ileocecal valve. The colonoscopy was                         performed with moderate difficulty due to significant                         looping. Successful completion of the procedure was                         aided by applying abdominal pressure. The patient                         tolerated the procedure well. The quality of the bowel                         preparation was evaluated using the BBPS [facility] Bowel                         Preparation Scale) with scores  of: Right Colon = 3,                         Transverse Colon = 3 and Left Colon = 3 (entire mucosa                         seen well with no residual staining, small fragments                         of stool or opaque liquid). The total BBPS score                         equals 9. The ileocecal valve, appendiceal orifice,                         and rectum were photographed. Findings:      The perianal and digital rectal examinations were normal. Pertinent       negatives include normal sphincter tone and no palpable rectal lesions.      Three sessile polyps were found in the descending colon and cecum. The       polyps were 4 to 9 mm in size. These polyps were removed with a cold       snare. Resection and retrieval were complete. Estimated blood loss: none.      The retroflexed view of the distal rectum and anal verge was normal and       showed no anal or rectal abnormalities. Impression:            - Three 4 to 9 mm polyps in the descending colon and                         in the cecum, removed with a cold snare. Resected and  retrieved.                        - The distal rectum and anal verge are normal on                         retroflexion view. Recommendation:        - Discharge patient to home (with escort).                        - Resume previous diet today.                        - Continue present medications.                        - Await pathology results.                        - Repeat colonoscopy in 3 years for surveillance of                         multiple polyps. Procedure Code(s):     --- Professional ---                        [phone], Colonoscopy, flexible; with removal of                         tumor(s), polyp(s), or other lesion(s) by snare                         technique Diagnosis Code(s):     --- Professional ---                        Z12.11, Encounter for screening for malignant neoplasm                         of colon                         D12.4, Benign neoplasm of descending colon                        D12.0, Benign neoplasm of cecum CPT copyright 2022 American Medical Association. All rights reserved. The codes documented in this report are preliminary and upon coder review may  be revised to meet current compliance requirements. Dr. Corinn Brooklyn Corinn Jess Brooklyn MD, MD 10/28/2023 8:56:29 AM This report has been signed electronically. Number of Addenda: 0 Note Initiated On: 10/28/2023 8:24 AM Scope Withdrawal Time: 0 hours 14 minutes 44 seconds  Total Procedure Duration: 0 hours 19 minutes 48 seconds  Estimated Blood Loss:  Estimated blood loss: none.      [facility]

## 2023-10-31 LAB — SURGICAL PATHOLOGY

## 2023-11-01 ENCOUNTER — Ambulatory Visit: Payer: Self-pay | Admitting: Gastroenterology

## 2023-11-01 NOTE — Progress Notes (Signed)
 Recommend surveillance colonoscopy in 3 years  RV

## 2023-12-06 ENCOUNTER — Encounter: Payer: 59 | Admitting: Dermatology

## 2023-12-08 ENCOUNTER — Encounter: Payer: Self-pay | Admitting: Dermatology

## 2023-12-08 ENCOUNTER — Ambulatory Visit (INDEPENDENT_AMBULATORY_CARE_PROVIDER_SITE_OTHER): Admitting: Dermatology

## 2023-12-08 DIAGNOSIS — L821 Other seborrheic keratosis: Secondary | ICD-10-CM | POA: Diagnosis not present

## 2023-12-08 DIAGNOSIS — L578 Other skin changes due to chronic exposure to nonionizing radiation: Secondary | ICD-10-CM

## 2023-12-08 DIAGNOSIS — Z86018 Personal history of other benign neoplasm: Secondary | ICD-10-CM

## 2023-12-08 DIAGNOSIS — L570 Actinic keratosis: Secondary | ICD-10-CM | POA: Diagnosis not present

## 2023-12-08 DIAGNOSIS — W908XXA Exposure to other nonionizing radiation, initial encounter: Secondary | ICD-10-CM

## 2023-12-08 DIAGNOSIS — L814 Other melanin hyperpigmentation: Secondary | ICD-10-CM

## 2023-12-08 DIAGNOSIS — Z1283 Encounter for screening for malignant neoplasm of skin: Secondary | ICD-10-CM | POA: Diagnosis not present

## 2023-12-08 DIAGNOSIS — D229 Melanocytic nevi, unspecified: Secondary | ICD-10-CM

## 2023-12-08 DIAGNOSIS — D1801 Hemangioma of skin and subcutaneous tissue: Secondary | ICD-10-CM | POA: Diagnosis not present

## 2023-12-08 DIAGNOSIS — Z7189 Other specified counseling: Secondary | ICD-10-CM

## 2023-12-08 NOTE — Progress Notes (Signed)
 Follow-Up Visit   Subjective  Lori Farley is a 63 y.o. female who presents for the following: Skin Cancer Screening and Full Body Skin Exam; Hx of AK's and Dysplastic Nevus. Patient reports area of concern on the chest.   The patient presents for Total-Body Skin Exam (TBSE) for skin cancer screening and mole check. The patient has spots, moles and lesions to be evaluated, some may be new or changing and the patient may have concern these could be cancer.    The following portions of the chart were reviewed this encounter and updated as appropriate: medications, allergies, medical history  Review of Systems:  No other skin or systemic complaints except as noted in HPI or Assessment and Plan.  Objective  Well appearing patient in no apparent distress; mood and affect are within normal limits.  A full examination was performed including scalp, head, eyes, ears, nose, lips, neck, chest, axillae, abdomen, back, buttocks, bilateral upper extremities, bilateral lower extremities, hands, feet, fingers, toes, fingernails, and toenails. All findings within normal limits unless otherwise noted below.   Relevant physical exam findings are noted in the Assessment and Plan.  Lower sternum x4, Mid chest x1, Right chest x2 (7) Pink scaly macules/papules  Assessment & Plan   SKIN CANCER SCREENING PERFORMED TODAY.  ACTINIC DAMAGE WITH PRECANCEROUS ACTINIC KERATOSES Counseling for Topical Chemotherapy Management: Patient exhibits: - Severe, confluent actinic changes with pre-cancerous actinic keratoses that is secondary to cumulative UV radiation exposure over time - Condition that is severe; chronic, not at goal. - diffuse scaly erythematous macules and papules with underlying dyspigmentation - Discussed Prescription Field Treatment topical Chemotherapy for Severe, Chronic Confluent Actinic Changes with Pre-Cancerous Actinic Keratoses Field treatment involves treatment of an entire area of  skin that has confluent Actinic Changes (Sun/ Ultraviolet light damage) and PreCancerous Actinic Keratoses by method of PhotoDynamic Therapy (PDT) and/or prescription Topical Chemotherapy agents such as 5-fluorouracil, 5-fluorouracil/calcipotriene, and/or imiquimod.  The purpose is to decrease the number of clinically evident and subclinical PreCancerous lesions to prevent progression to development of skin cancer by chemically destroying early precancer changes that may or may not be visible.  It has been shown to reduce the risk of developing skin cancer in the treated area. As a result of treatment, redness, scaling, crusting, and open sores may occur during treatment course. One or more than one of these methods may be used and may have to be used several times to control, suppress and eliminate the PreCancerous changes. Discussed treatment course, expected reaction, and possible side effects. - Recommend daily broad spectrum sunscreen SPF 30+ to sun-exposed areas, reapply every 2 hours as needed.  - Staying in the shade or wearing long sleeves, sun glasses (UVA+UVB protection) and wide brim hats (4-inch brim around the entire circumference of the hat) are also recommended. - Call for new or changing lesions.  - Recommend blue light PDT with debridement to chest.  Patient may schedule.     LENTIGINES, SEBORRHEIC KERATOSES, HEMANGIOMAS - Benign normal skin lesions - Benign-appearing - Call for any changes  MELANOCYTIC NEVI - Tan-brown and/or pink-flesh-colored symmetric macules and papules - 2 mm gray brown macule at the left upper calf - Benign appearing on exam today - Observation - Call clinic for new or changing moles - Recommend daily use of broad spectrum spf 30+ sunscreen to sun-exposed areas.   HISTORY OF DYSPLASTIC NEVUS L superior buttock, slight  No evidence of recurrence today Recommend regular full body skin exams Recommend daily  broad spectrum sunscreen SPF 30+ to sun-exposed  areas, reapply every 2 hours as needed.  Call if any new or changing lesions are noted between office visits   HEMANGIOMA  Exam: red papule(s) right nasal bridge and left temporal hairline  Treatment Plan:  Discussed benign nature. Recommend observation. Call for changes. Discussed electrodesiccation treatment to remove. Discussed cosmetic procedure ED, noncovered.  $60 for 1st lesion and $15 for each additional lesion if done on the same day.  Maximum charge $350.  One touch-up treatment included no charge. Discussed risks of treatment including dyspigmentation, small scar, and/or recurrence. Recommend daily broad spectrum sunscreen SPF 30+/photoprotection to treated areas once healed.    ACTINIC KERATOSIS (7) Lower sternum x4, Mid chest x1, Right chest x2 (7) Actinic keratoses are precancerous spots that appear secondary to cumulative UV radiation exposure/sun exposure over time. They are chronic with expected duration over 1 year. A portion of actinic keratoses will progress to squamous cell carcinoma of the skin. It is not possible to reliably predict which spots will progress to skin cancer and so treatment is recommended to prevent development of skin cancer.  Recommend daily broad spectrum sunscreen SPF 30+ to sun-exposed areas, reapply every 2 hours as needed.  Recommend staying in the shade or wearing long sleeves, sun glasses (UVA+UVB protection) and wide brim hats (4-inch brim around the entire circumference of the hat). Call for new or changing lesions. Destruction of lesion - Lower sternum x4, Mid chest x1, Right chest x2 (7)  Destruction method: cryotherapy   Informed consent: discussed and consent obtained   Lesion destroyed using liquid nitrogen: Yes   Region frozen until ice ball extended beyond lesion: Yes   Outcome: patient tolerated procedure well with no complications   Post-procedure details: wound care instructions given   Additional details:  Prior to procedure,  discussed risks of blister formation, small wound, skin dyspigmentation, or rare scar following cryotherapy. Recommend Vaseline ointment to treated areas while healing.   Return in about 1 year (around 12/07/2024) for TBSE.  I, Emerick Ege, CMA am acting as scribe for Rexene Rattler, MD.   Documentation: I have reviewed the above documentation for accuracy and completeness, and I agree with the above.  Rexene Rattler, MD

## 2023-12-08 NOTE — Patient Instructions (Signed)
 Cryotherapy Aftercare  Wash gently with soap and water everyday.   Apply Vaseline and Band-Aid daily until healed.   Seborrheic Keratosis  What causes seborrheic keratoses? Seborrheic keratoses are harmless, common skin growths that first appear during adult life.  As time goes by, more growths appear.  Some people may develop a large number of them.  Seborrheic keratoses appear on both covered and uncovered body parts.  They are not caused by sunlight.  The tendency to develop seborrheic keratoses can be inherited.  They vary in color from skin-colored to gray, brown, or even black.  They can be either smooth or have a rough, warty surface.   Seborrheic keratoses are superficial and look as if they were stuck on the skin.  Under the microscope this type of keratosis looks like layers upon layers of skin.  That is why at times the top layer may seem to fall off, but the rest of the growth remains and re-grows.    Treatment Seborrheic keratoses do not need to be treated, but can easily be removed in the office.  Seborrheic keratoses often cause symptoms when they rub on clothing or jewelry.  Lesions can be in the way of shaving.  If they become inflamed, they can cause itching, soreness, or burning.  Removal of a seborrheic keratosis can be accomplished by freezing, burning, or surgery. If any spot bleeds, scabs, or grows rapidly, please return to have it checked, as these can be an indication of a skin cancer.  Recommend daily broad spectrum sunscreen SPF 30+ to sun-exposed areas, reapply every 2 hours as needed. Call for new or changing lesions.  Staying in the shade or wearing long sleeves, sun glasses (UVA+UVB protection) and wide brim hats (4-inch brim around the entire circumference of the hat) are also recommended for sun protection.    Melanoma ABCDEs  Melanoma is the most dangerous type of skin cancer, and is the leading cause of death from skin disease.  You are more likely to develop  melanoma if you: Have light-colored skin, light-colored eyes, or red or blond hair Spend a lot of time in the sun Tan regularly, either outdoors or in a tanning bed Have had blistering sunburns, especially during childhood Have a close family member who has had a melanoma Have atypical moles or large birthmarks  Early detection of melanoma is key since treatment is typically straightforward and cure rates are extremely high if we catch it early.   The first sign of melanoma is often a change in a mole or a new dark spot.  The ABCDE system is a way of remembering the signs of melanoma.  A for asymmetry:  The two halves do not match. B for border:  The edges of the growth are irregular. C for color:  A mixture of colors are present instead of an even brown color. D for diameter:  Melanomas are usually (but not always) greater than 6mm - the size of a pencil eraser. E for evolution:  The spot keeps changing in size, shape, and color.  Please check your skin once per month between visits. You can use a small mirror in front and a large mirror behind you to keep an eye on the back side or your body.   If you see any new or changing lesions before your next follow-up, please call to schedule a visit.  Please continue daily skin protection including broad spectrum sunscreen SPF 30+ to sun-exposed areas, reapplying every 2 hours as  needed when you're outdoors.     Due to recent changes in healthcare laws, you may see results of your pathology and/or laboratory studies on MyChart before the doctors have had a chance to review them. We understand that in some cases there may be results that are confusing or concerning to you. Please understand that not all results are received at the same time and often the doctors may need to interpret multiple results in order to provide you with the best plan of care or course of treatment. Therefore, we ask that you please give us  2 business days to thoroughly  review all your results before contacting the office for clarification. Should we see a critical lab result, you will be contacted sooner.   If You Need Anything After Your Visit  If you have any questions or concerns for your doctor, please call our main line at [phone] and press option 4 to reach your doctor's medical assistant. If no one answers, please leave a voicemail as directed and we will return your call as soon as possible. Messages left after 4 pm will be answered the following business day.   You may also send us  a message via MyChart. We typically respond to MyChart messages within 1-2 business days.  For prescription refills, please ask your pharmacy to contact our office. Our fax number is [phone].  If you have an urgent issue when the clinic is closed that cannot wait until the next business day, you can page your doctor at the number below.    Please note that while we do our best to be available for urgent issues outside of office hours, we are not available 24/7.   If you have an urgent issue and are unable to reach us , you may choose to seek medical care at your doctor's office, retail clinic, urgent care center, or emergency room.  If you have a medical emergency, please immediately call 911 or go to the emergency department.  Pager Numbers  - Dr. Hester: [phone]  - Dr. Jackquline: [phone]  - Dr. Claudene: [phone]   - Dr. Raymund: [phone]  In the event of inclement weather, please call our main line at [phone] for an update on the status of any delays or closures.  Dermatology Medication Tips: Please keep the boxes that topical medications come in in order to help keep track of the instructions about where and how to use these. Pharmacies typically print the medication instructions only on the boxes and not directly on the medication tubes.   If your medication is too expensive, please contact our office at [phone] option 4 or  send us  a message through MyChart.   We are unable to tell what your co-pay for medications will be in advance as this is different depending on your insurance coverage. However, we may be able to find a substitute medication at lower cost or fill out paperwork to get insurance to cover a needed medication.   If a prior authorization is required to get your medication covered by your insurance company, please allow us  1-2 business days to complete this process.  Drug prices often vary depending on where the prescription is filled and some pharmacies may offer cheaper prices.  The website www.goodrx.com contains coupons for medications through different pharmacies. The prices here do not account for what the cost may be with help from insurance (it may be cheaper with your insurance), but the website can give you the price if you did not  use any insurance.  - You can print the associated coupon and take it with your prescription to the pharmacy.  - You may also stop by our office during regular business hours and pick up a GoodRx coupon card.  - If you need your prescription sent electronically to a different pharmacy, notify our office through [facility] or by phone at [phone] option 4.     Si Usted Necesita Algo Despus de Su Visita  Tambin puede enviarnos un mensaje a travs de Clinical Cytogeneticist. Por lo general respondemos a los mensajes de MyChart en el transcurso de 1 a 2 das hbiles.  Para renovar recetas, por favor pida a su farmacia que se ponga en contacto con nuestra oficina. Randi lakes de fax es [place] [phone].  Si tiene un asunto urgente cuando la clnica est cerrada y que no puede esperar hasta el siguiente da hbil, puede llamar/localizar a su doctor(a) al nmero que aparece a continuacin.   Por favor, tenga en cuenta que aunque hacemos todo lo posible para estar disponibles para asuntos urgentes fuera del horario de [place], no estamos disponibles las 24 horas del  da, los 7 [address]  [address] de la [place].   Si tiene un problema urgente y no puede comunicarse con nosotros, puede optar por buscar atencin mdica  en el consultorio de su doctor(a), en una clnica privada, en un centro de atencin urgente o en una sala de emergencias.  Si tiene engineer, drilling, por favor llame inmediatamente al 911 o vaya a la sala de emergencias.  Nmeros de bper  - Dr. Hester: [phone]  - Dra. Jackquline: [phone]  - Dr. Claudene: [phone]  - Dra. Kitts: [phone]  En caso de inclemencias del [place], por favor llame a nuestra lnea principal al [phone] para una actualizacin sobre el estado de cualquier retraso o cierre.  Consejos para la medicacin en dermatologa: Por favor, guarde las cajas en las que vienen los medicamentos de uso tpico para ayudarle a seguir las instrucciones sobre dnde y cmo usarlos. Las farmacias generalmente imprimen las instrucciones del medicamento slo en las cajas y no directamente en los tubos del [place].   Si su medicamento es muy caro, por favor, pngase en contacto con landry rieger llamando al [phone] y presione la opcin 4 o envenos un mensaje a travs de Clinical Cytogeneticist.   No podemos decirle cul ser su copago por los medicamentos por adelantado ya que esto es diferente dependiendo de la cobertura de su seguro. Sin embargo, es posible que podamos encontrar un medicamento sustituto a audiological scientist un formulario para que el seguro cubra el medicamento que se considera necesario.   Si se requiere una autorizacin previa para que su compaa de seguros cubra su medicamento, por favor permtanos de 1 a 2 das hbiles para completar este proceso.  Los precios de los medicamentos varan con frecuencia dependiendo del environmental consultant de dnde se surte la receta y alguna farmacias pueden ofrecer precios ms baratos.  El sitio web www.goodrx.com tiene cupones para medicamentos de health and safety inspector. Los precios aqu no  tienen en cuenta lo que podra costar con la ayuda del seguro (puede ser ms barato con su seguro), pero el sitio web puede darle el precio si no utiliz tourist information centre manager.  - Puede imprimir el cupn correspondiente y llevarlo con su receta a la farmacia.  - Tambin puede pasar por nuestra oficina durante el horario de atencin regular y education officer, museum una tarjeta de cupones de GoodRx.  - Si necesita que  su receta se enve electrnicamente a una farmacia diferente, informe a nuestra oficina a travs de MyChart de [place] o por telfono llamando al [phone] y presione la opcin 4.

## 2023-12-13 NOTE — Telephone Encounter (Signed)
 Open in error

## 2024-12-11 ENCOUNTER — Encounter: Admitting: Dermatology
